# Patient Record
Sex: Female | Born: 1955 | Race: White | Hispanic: No | Marital: Married
Health system: Southern US, Community
[De-identification: ages and names within clinical notes are randomized; demographics above are authoritative.]

## PROBLEM LIST (undated history)

## (undated) DIAGNOSIS — C4490 Unspecified malignant neoplasm of skin, unspecified: Secondary | ICD-10-CM

## (undated) DIAGNOSIS — F419 Anxiety disorder, unspecified: Secondary | ICD-10-CM

## (undated) HISTORY — PX: TUBAL LIGATION: SHX77

## (undated) HISTORY — DX: Anxiety disorder, unspecified: F41.9

## (undated) HISTORY — DX: Unspecified malignant neoplasm of skin, unspecified: C44.90

---

## 2006-08-27 ENCOUNTER — Ambulatory Visit: Payer: Self-pay

## 2008-01-12 ENCOUNTER — Ambulatory Visit: Payer: Self-pay

## 2008-05-18 ENCOUNTER — Ambulatory Visit: Payer: Self-pay | Admitting: Family Medicine

## 2009-04-09 ENCOUNTER — Emergency Department: Payer: Self-pay | Admitting: Emergency Medicine

## 2010-01-29 ENCOUNTER — Ambulatory Visit: Payer: Self-pay | Admitting: Internal Medicine

## 2010-02-23 ENCOUNTER — Ambulatory Visit: Payer: Self-pay | Admitting: Internal Medicine

## 2010-03-01 ENCOUNTER — Ambulatory Visit: Payer: Self-pay | Admitting: Internal Medicine

## 2010-03-31 ENCOUNTER — Ambulatory Visit: Payer: Self-pay | Admitting: Internal Medicine

## 2011-06-12 ENCOUNTER — Ambulatory Visit: Payer: Self-pay

## 2011-09-24 ENCOUNTER — Ambulatory Visit: Payer: Self-pay | Admitting: Orthopedic Surgery

## 2012-10-21 ENCOUNTER — Ambulatory Visit: Payer: Self-pay

## 2013-11-23 ENCOUNTER — Ambulatory Visit: Payer: Self-pay

## 2013-11-24 ENCOUNTER — Ambulatory Visit: Payer: Self-pay

## 2014-10-28 ENCOUNTER — Other Ambulatory Visit: Payer: Self-pay | Admitting: Unknown Physician Specialty

## 2014-10-28 DIAGNOSIS — R922 Inconclusive mammogram: Secondary | ICD-10-CM

## 2014-11-29 ENCOUNTER — Ambulatory Visit: Payer: Self-pay

## 2014-11-29 ENCOUNTER — Ambulatory Visit
Admission: RE | Admit: 2014-11-29 | Discharge: 2014-11-29 | Disposition: A | Discharge status: Home / Self Care | Payer: No Typology Code available for payment source | Source: Ambulatory Visit | Attending: Unknown Physician Specialty | Admitting: Unknown Physician Specialty

## 2014-11-29 ENCOUNTER — Ambulatory Visit: Payer: No Typology Code available for payment source

## 2014-11-29 DIAGNOSIS — R928 Other abnormal and inconclusive findings on diagnostic imaging of breast: Secondary | ICD-10-CM | POA: Insufficient documentation

## 2014-11-29 DIAGNOSIS — R922 Inconclusive mammogram: Secondary | ICD-10-CM

## 2018-09-09 ENCOUNTER — Other Ambulatory Visit: Payer: Self-pay | Admitting: Pediatrics

## 2018-09-09 DIAGNOSIS — Z1231 Encounter for screening mammogram for malignant neoplasm of breast: Secondary | ICD-10-CM

## 2018-09-09 DIAGNOSIS — Z780 Asymptomatic menopausal state: Secondary | ICD-10-CM

## 2019-04-14 ENCOUNTER — Other Ambulatory Visit: Payer: Self-pay

## 2019-04-14 DIAGNOSIS — Z20822 Contact with and (suspected) exposure to covid-19: Secondary | ICD-10-CM

## 2019-04-15 LAB — NOVEL CORONAVIRUS, NAA: SARS-CoV-2, NAA: NOT DETECTED

## 2019-05-21 ENCOUNTER — Other Ambulatory Visit: Payer: Self-pay

## 2019-05-21 DIAGNOSIS — Z20822 Contact with and (suspected) exposure to covid-19: Secondary | ICD-10-CM

## 2019-05-23 LAB — NOVEL CORONAVIRUS, NAA: SARS-CoV-2, NAA: NOT DETECTED

## 2019-08-14 ENCOUNTER — Ambulatory Visit: Payer: Medicare HMO | Attending: Internal Medicine

## 2019-08-14 DIAGNOSIS — Z23 Encounter for immunization: Secondary | ICD-10-CM | POA: Insufficient documentation

## 2019-08-14 NOTE — Progress Notes (Signed)
   Covid-19 Vaccination Clinic  Name:  Stacy Hardy    MRN: [id number] DOB: 1956-03-24  08/14/2019  Stacy Hardy was observed post Covid-19 immunization for 15 minutes without incidence. She was provided with Vaccine Information Sheet and instruction to access the V-Safe system.   Stacy Hardy was instructed to call 911 with any severe reactions post vaccine: Marland Kitchen Difficulty breathing  . Swelling of your face and throat  . A fast heartbeat  . A bad rash all over your body  . Dizziness and weakness    Immunizations Administered    Name Date Dose VIS Date Route   Pfizer COVID-19 Vaccine 08/14/2019 11:24 AM 0.3 mL 06/11/2019 Intramuscular   Manufacturer: [place]   Lot: [id number]   [place]: [id number]

## 2019-09-04 ENCOUNTER — Ambulatory Visit: Payer: Medicare HMO | Attending: Internal Medicine

## 2019-09-04 DIAGNOSIS — Z23 Encounter for immunization: Secondary | ICD-10-CM | POA: Insufficient documentation

## 2019-09-04 NOTE — Progress Notes (Signed)
   Covid-19 Vaccination Clinic  Name:  Stacy Hardy    MRN: [id number] DOB: 15-Nov-1955  09/04/2019  Stacy Hardy was observed post Covid-19 immunization for 15 minutes without incident. She was provided with Vaccine Information Sheet and instruction to access the V-Safe system.   Stacy Hardy was instructed to call 911 with any severe reactions post vaccine: Marland Kitchen Difficulty breathing  . Swelling of face and throat  . A fast heartbeat  . A bad rash all over body  . Dizziness and weakness   Immunizations Administered    Name Date Dose VIS Date Route   Pfizer COVID-19 Vaccine 09/04/2019 10:42 AM 0.3 mL 06/11/2019 Intramuscular   Manufacturer: [place]   Lot: [id number]   [place]: [id number]

## 2020-09-18 ENCOUNTER — Other Ambulatory Visit: Payer: Self-pay | Admitting: Pediatrics

## 2020-09-18 DIAGNOSIS — Z1231 Encounter for screening mammogram for malignant neoplasm of breast: Secondary | ICD-10-CM

## 2020-10-05 ENCOUNTER — Ambulatory Visit
Admission: RE | Admit: 2020-10-05 | Discharge: 2020-10-05 | Disposition: A | Discharge status: Home / Self Care | Payer: Medicare HMO | Source: Ambulatory Visit | Attending: Pediatrics | Admitting: Pediatrics

## 2020-10-05 ENCOUNTER — Other Ambulatory Visit: Payer: Self-pay

## 2020-10-05 DIAGNOSIS — Z1231 Encounter for screening mammogram for malignant neoplasm of breast: Secondary | ICD-10-CM | POA: Diagnosis not present

## 2020-11-20 ENCOUNTER — Other Ambulatory Visit: Payer: Self-pay | Admitting: Pediatrics

## 2020-11-20 DIAGNOSIS — Z780 Asymptomatic menopausal state: Secondary | ICD-10-CM

## 2022-01-09 IMAGING — MG MM DIGITAL SCREENING BILAT W/ TOMO AND CAD
8 series · 8 of 24 positions shown · non-contrast
Comparison: Previous exam(s).

CLINICAL DATA: Screening.

EXAM:
DIGITAL SCREENING BILATERAL MAMMOGRAM WITH TOMOSYNTHESIS AND CAD
TECHNIQUE: Bilateral screening digital craniocaudal and mediolateral oblique
mammograms were obtained. Bilateral screening digital breast
tomosynthesis was performed. The images were evaluated with
computer-aided detection.

[L CC synth-2D]
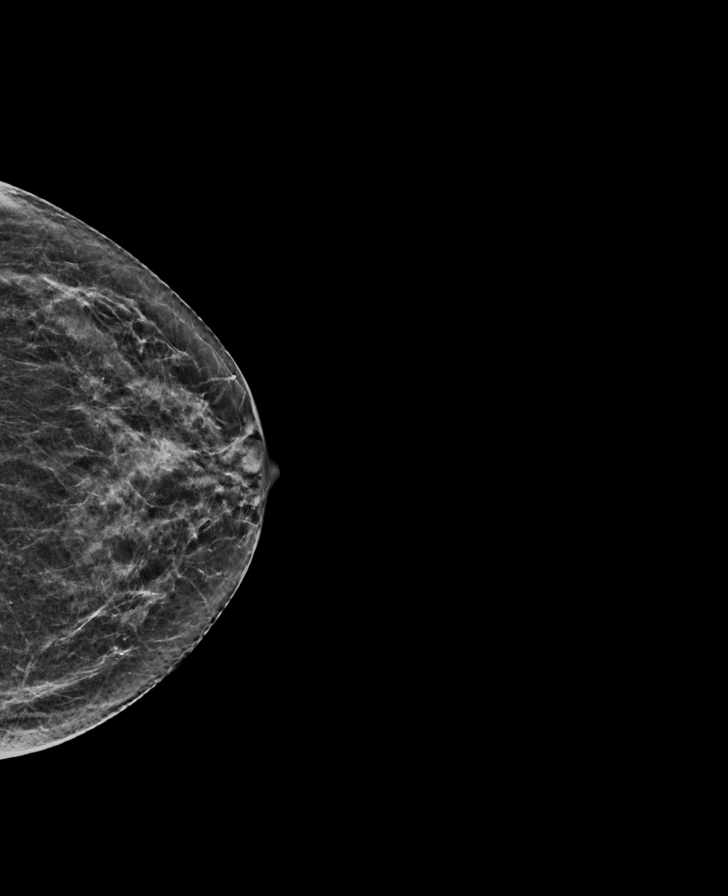

[R MLO synth-2D]
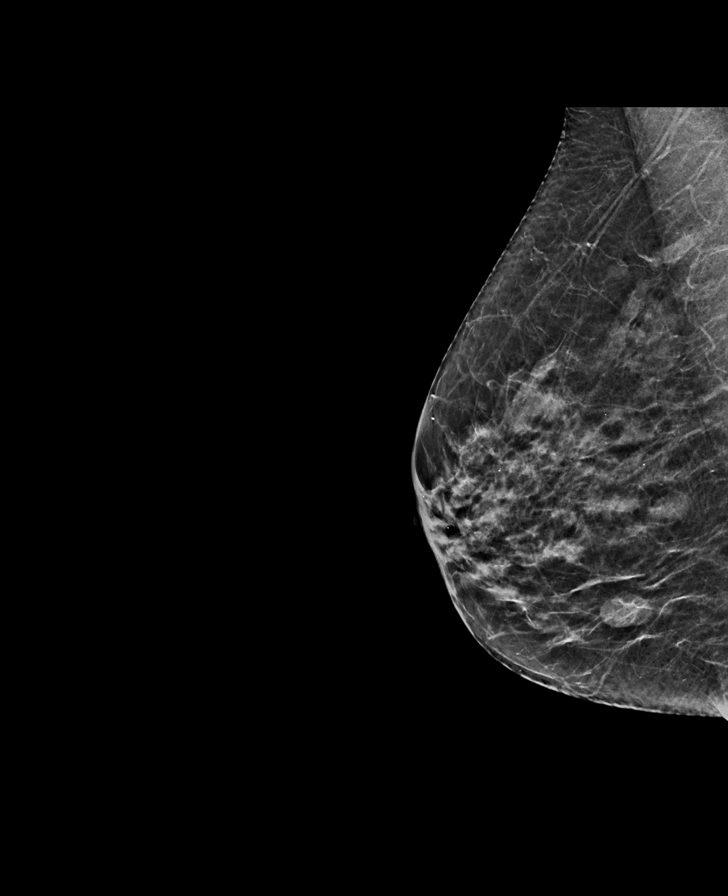

[L MLO synth-2D]
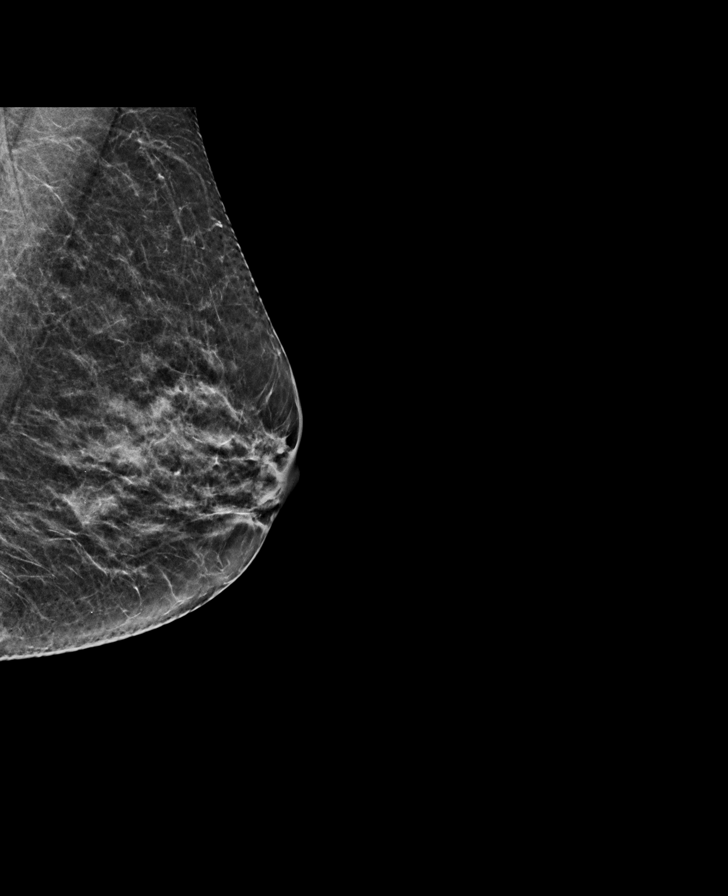

[R CC synth-2D]
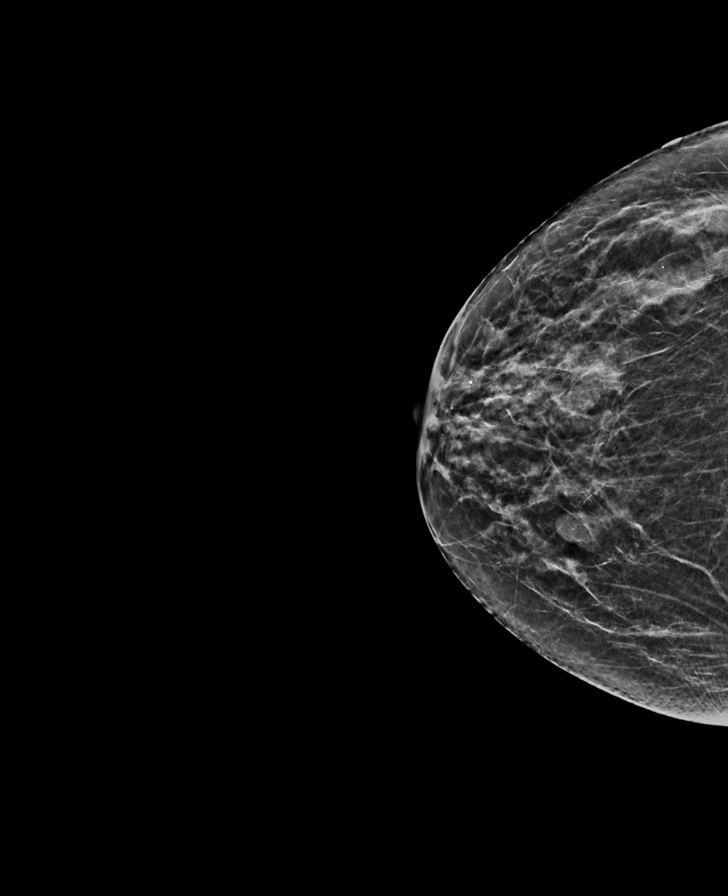

[R MLO tomo · tomo slice 27/52.0]
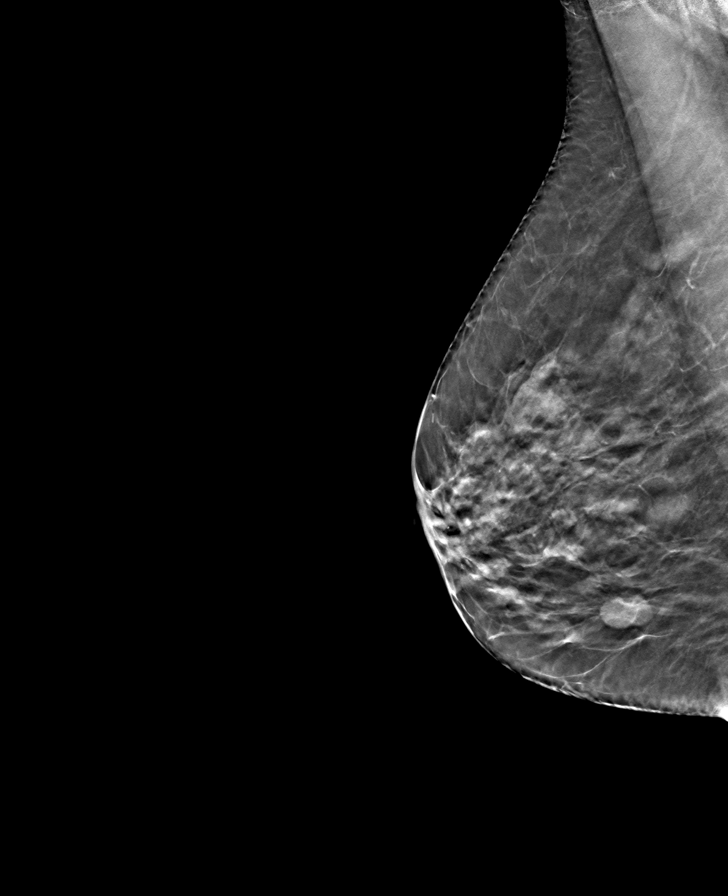

[L CC tomo · tomo slice 26/51.0]
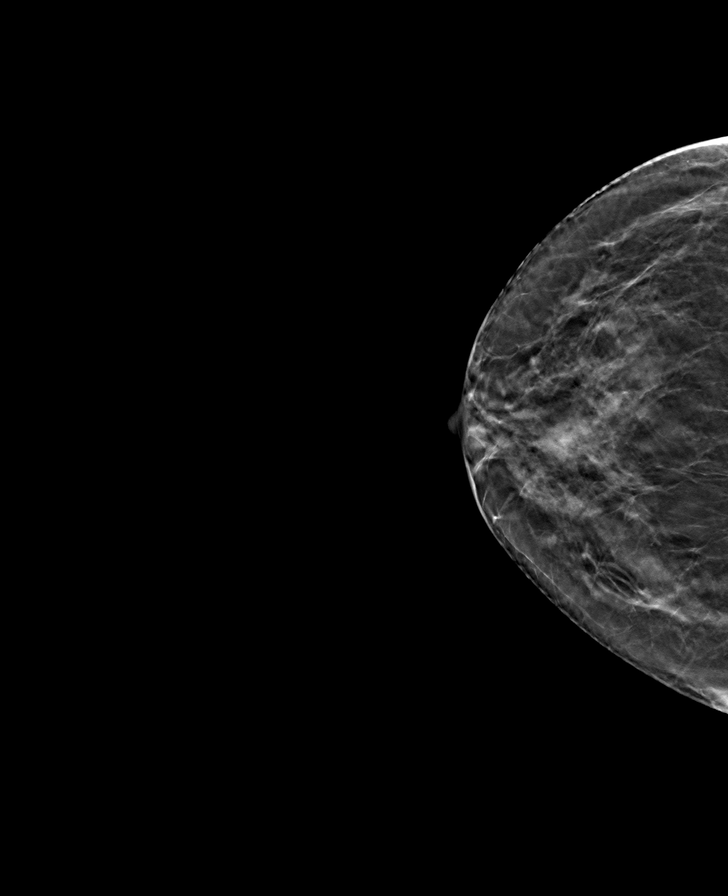

[R CC tomo · tomo slice 25/50.0]
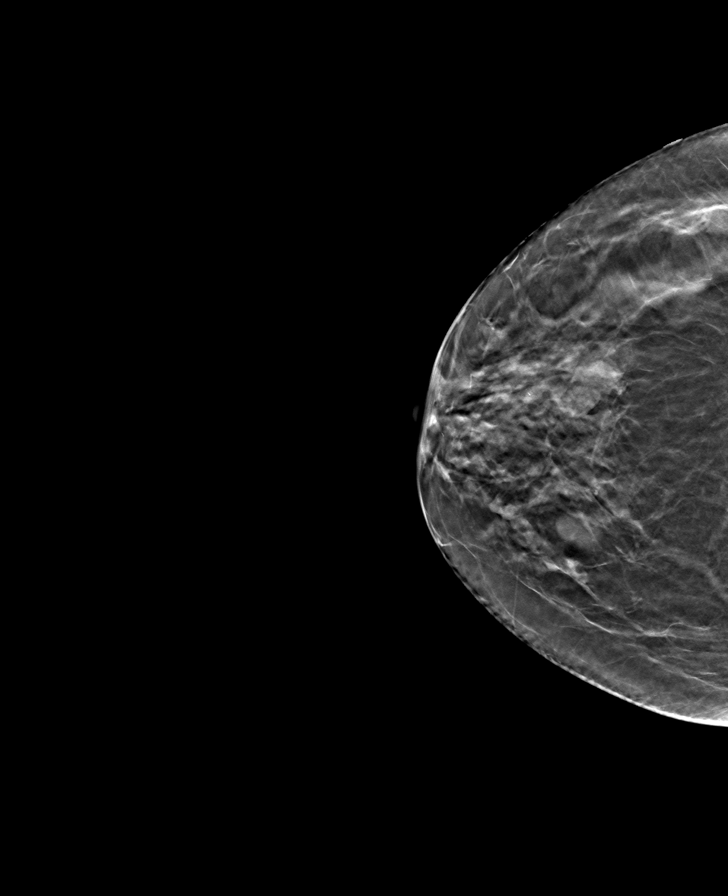

[L MLO tomo · tomo slice 27/54.0]
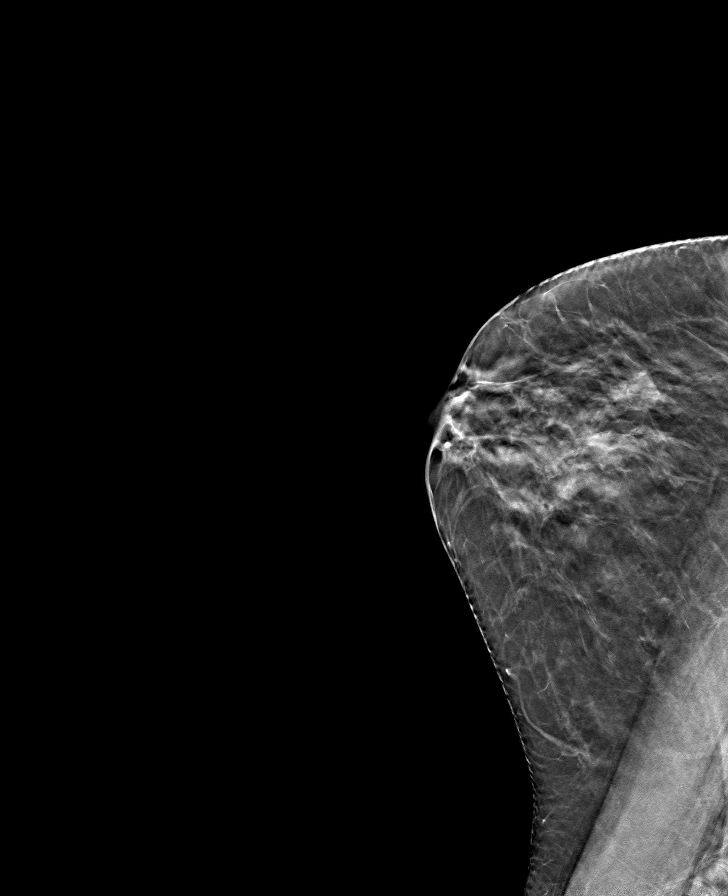

[8 of 24 positions shown; findings below may reference images not displayed]

ACR Breast Density Category c: The breast tissue is heterogeneously
dense, which may obscure small masses.
FINDINGS: There are no findings suspicious for malignancy. The images were
evaluated with computer-aided detection.
IMPRESSION: No mammographic evidence of malignancy. A result letter of this
screening mammogram will be mailed directly to the patient.

RECOMMENDATION:
Screening mammogram in one year. (Code:T4-5-GWO)

BI-RADS CATEGORY  1: Negative.

## 2022-01-29 ENCOUNTER — Other Ambulatory Visit: Payer: Self-pay | Admitting: Pediatrics

## 2022-01-29 DIAGNOSIS — Z1231 Encounter for screening mammogram for malignant neoplasm of breast: Secondary | ICD-10-CM

## 2022-02-05 ENCOUNTER — Ambulatory Visit
Admission: RE | Admit: 2022-02-05 | Discharge: 2022-02-05 | Disposition: A | Discharge status: Home / Self Care | Payer: Medicare HMO | Source: Ambulatory Visit | Attending: Pediatrics | Admitting: Pediatrics

## 2022-02-05 DIAGNOSIS — Z1231 Encounter for screening mammogram for malignant neoplasm of breast: Secondary | ICD-10-CM | POA: Diagnosis present

## 2022-03-01 ENCOUNTER — Other Ambulatory Visit: Payer: Self-pay | Admitting: Pediatrics

## 2022-03-01 DIAGNOSIS — Z780 Asymptomatic menopausal state: Secondary | ICD-10-CM

## 2022-04-16 ENCOUNTER — Ambulatory Visit
Admission: RE | Admit: 2022-04-16 | Discharge: 2022-04-16 | Disposition: A | Discharge status: Home / Self Care | Payer: Medicare HMO | Source: Ambulatory Visit | Attending: Pediatrics | Admitting: Pediatrics

## 2022-04-16 DIAGNOSIS — Z780 Asymptomatic menopausal state: Secondary | ICD-10-CM | POA: Diagnosis present

## 2022-05-06 ENCOUNTER — Ambulatory Visit: Payer: Medicare HMO | Admitting: Dermatology

## 2022-06-19 ENCOUNTER — Ambulatory Visit: Payer: Medicare HMO | Admitting: Dermatology

## 2022-06-19 VITALS — BP 140/85 | HR 96

## 2022-06-19 DIAGNOSIS — L578 Other skin changes due to chronic exposure to nonionizing radiation: Secondary | ICD-10-CM

## 2022-06-19 DIAGNOSIS — C4361 Malignant melanoma of right upper limb, including shoulder: Secondary | ICD-10-CM | POA: Diagnosis not present

## 2022-06-19 DIAGNOSIS — C439 Malignant melanoma of skin, unspecified: Secondary | ICD-10-CM

## 2022-06-19 DIAGNOSIS — L820 Inflamed seborrheic keratosis: Secondary | ICD-10-CM

## 2022-06-19 DIAGNOSIS — I781 Nevus, non-neoplastic: Secondary | ICD-10-CM

## 2022-06-19 DIAGNOSIS — D485 Neoplasm of uncertain behavior of skin: Secondary | ICD-10-CM

## 2022-06-19 HISTORY — DX: Malignant melanoma of skin, unspecified: C43.9

## 2022-06-19 NOTE — Progress Notes (Signed)
   New Patient Visit  Subjective  Stacy Hardy is a 66 y.o. female who presents for the following: Other (Spots of right arm and face). The patient has spots, moles and lesions to be evaluated, some may be new or changing and the patient has concerns that these could be cancer.  The following portions of the chart were reviewed this encounter and updated as appropriate:   Allergies  Meds  Problems  Med Hx  Surg Hx  Fam Hx     Review of Systems:  No other skin or systemic complaints except as noted in HPI or Assessment and Plan.  Objective  Well appearing patient in no apparent distress; mood and affect are within normal limits.  A focused examination was performed including face, right arm. Relevant physical exam findings are noted in the Assessment and Plan.  Right forearm 2.0 x 1.2 cm crusted pink patch  Right Temple Erythematous stuck-on, waxy papule or plaque  Right Tip of Nose Dilated blood vessel   Assessment & Plan  Neoplasm of uncertain behavior of skin Right forearm  Skin / nail biopsy Type of biopsy: tangential   Informed consent: discussed and consent obtained   Timeout: patient name, date of birth, surgical site, and procedure verified   Procedure prep:  Patient was prepped and draped in usual sterile fashion Prep type:  Isopropyl alcohol Anesthesia: the lesion was anesthetized in a standard fashion   Anesthetic:  1% lidocaine w/ epinephrine 1-100,000 buffered w/ 8.4% NaHCO3 Instrument used: flexible razor blade   Hemostasis achieved with: pressure, aluminum chloride and electrodesiccation   Outcome: patient tolerated procedure well   Post-procedure details: sterile dressing applied and wound care instructions given   Dressing type: bandage and petrolatum    Specimen 1 - Surgical pathology Differential Diagnosis: BCC vs other  Check Margins: No  Inflamed seborrheic keratosis Right Temple  Destruction of lesion - Right Temple Complexity: simple    Destruction method: cryotherapy   Informed consent: discussed and consent obtained   Timeout:  patient name, date of birth, surgical site, and procedure verified Lesion destroyed using liquid nitrogen: Yes   Region frozen until ice ball extended beyond lesion: Yes   Outcome: patient tolerated procedure well with no complications   Post-procedure details: wound care instructions given    Telangiectasia Right Tip of Nose Mild blanching telangiectasia - benign.  Actinic Damage - chronic, secondary to cumulative UV radiation exposure/sun exposure over time - diffuse scaly erythematous macules with underlying dyspigmentation - Recommend daily broad spectrum sunscreen SPF 30+ to sun-exposed areas, reapply every 2 hours as needed.  - Recommend staying in the shade or wearing long sleeves, sun glasses (UVA+UVB protection) and wide brim hats (4-inch brim around the entire circumference of the hat). - Call for new or changing lesions.  Return if symptoms worsen or fail to improve.  I, Ashok Cordia, CMA, am acting as scribe for Sarina Ser, MD . Documentation: I have reviewed the above documentation for accuracy and completeness, and I agree with the above.  Sarina Ser, MD

## 2022-06-19 NOTE — Patient Instructions (Signed)
Cryotherapy Aftercare  Wash gently with soap and water everyday.   Apply Vaseline and Band-Aid daily until healed.  Wound Care Instructions  Cleanse wound gently with soap and water once a day then pat dry with clean gauze. Apply a thin coat of Petrolatum (petroleum jelly, "Vaseline") over the wound (unless you have an allergy to this). We recommend that you use a new, sterile tube of Vaseline. Do not pick or remove scabs. Do not remove the yellow or white "healing tissue" from the base of the wound.  Cover the wound with fresh, clean, nonstick gauze and secure with paper tape. You may use Band-Aids in place of gauze and tape if the wound is small enough, but would recommend trimming much of the tape off as there is often too much. Sometimes Band-Aids can irritate the skin.  You should call the office for your biopsy report after 1 week if you have not already been contacted.  If you experience any problems, such as abnormal amounts of bleeding, swelling, significant bruising, significant pain, or evidence of infection, please call the office immediately.  FOR ADULT SURGERY PATIENTS: If you need something for pain relief you may take 1 extra strength Tylenol (acetaminophen) AND 2 Ibuprofen (200mg each) together every 4 hours as needed for pain. (do not take these if you are allergic to them or if you have a reason you should not take them.) Typically, you may only need pain medication for 1 to 3 days.      Due to recent changes in healthcare laws, you may see results of your pathology and/or laboratory studies on MyChart before the doctors have had a chance to review them. We understand that in some cases there may be results that are confusing or concerning to you. Please understand that not all results are received at the same time and often the doctors may need to interpret multiple results in order to provide you with the best plan of care or course of treatment. Therefore, we ask that you  please give us 2 business days to thoroughly review all your results before contacting the office for clarification. Should we see a critical lab result, you will be contacted sooner.   If You Need Anything After Your Visit  If you have any questions or concerns for your doctor, please call our main line at [phone] and press option 4 to reach your doctor's medical assistant. If no one answers, please leave a voicemail as directed and we will return your call as soon as possible. Messages left after 4 pm will be answered the following business day.   You may also send us a message via MyChart. We typically respond to MyChart messages within 1-2 business days.  For prescription refills, please ask your pharmacy to contact our office. Our fax number is [phone].  If you have an urgent issue when the clinic is closed that cannot wait until the next business day, you can page your doctor at the number below.    Please note that while we do our best to be available for urgent issues outside of office hours, we are not available 24/7.   If you have an urgent issue and are unable to reach us, you may choose to seek medical care at your doctor's office, retail clinic, urgent care center, or emergency room.  If you have a medical emergency, please immediately call 911 or go to the emergency department.  Pager Numbers  - Dr. Kowalski: [phone]  -   Dr. Moye: [phone]  - Dr. Stewart: [phone]  In the event of inclement weather, please call our main line at [phone] for an update on the status of any delays or closures.  Dermatology Medication Tips: Please keep the boxes that topical medications come in in order to help keep track of the instructions about where and how to use these. Pharmacies typically print the medication instructions only on the boxes and not directly on the medication tubes.   If your medication is too expensive, please contact our office at  [phone] option 4 or send us a message through MyChart.   We are unable to tell what your co-pay for medications will be in advance as this is different depending on your insurance coverage. However, we may be able to find a substitute medication at lower cost or fill out paperwork to get insurance to cover a needed medication.   If a prior authorization is required to get your medication covered by your insurance company, please allow us 1-2 business days to complete this process.  Drug prices often vary depending on where the prescription is filled and some pharmacies may offer cheaper prices.  The website www.goodrx.com contains coupons for medications through different pharmacies. The prices here do not account for what the cost may be with help from insurance (it may be cheaper with your insurance), but the website can give you the price if you did not use any insurance.  - You can print the associated coupon and take it with your prescription to the pharmacy.  - You may also stop by our office during regular business hours and pick up a GoodRx coupon card.  - If you need your prescription sent electronically to a different pharmacy, notify our office through [facility] MyChart or by phone at [phone] option 4.     Si Usted Necesita Algo Despus de Su Visita  Tambin puede enviarnos un mensaje a travs de MyChart. Por lo general respondemos a los mensajes de MyChart en el transcurso de 1 a 2 das hbiles.  Para renovar recetas, por favor pida a su farmacia que se ponga en contacto con nuestra oficina. Nuestro nmero de fax es el [phone].  Si tiene un asunto urgente cuando la clnica est cerrada y que no puede esperar hasta el siguiente da hbil, puede llamar/localizar a su doctor(a) al nmero que aparece a continuacin.   Por favor, tenga en cuenta que aunque hacemos todo lo posible para estar disponibles para asuntos urgentes fuera del horario de oficina, no estamos  disponibles las 24 horas del da, los 7 das de la semana.   Si tiene un problema urgente y no puede comunicarse con nosotros, puede optar por buscar atencin mdica  en el consultorio de su doctor(a), en una clnica privada, en un centro de atencin urgente o en una sala de emergencias.  Si tiene una emergencia mdica, por favor llame inmediatamente al 911 o vaya a la sala de emergencias.  Nmeros de bper  - Dr. Kowalski: [phone]  - Dra. Moye: [phone]  - Dra. Stewart: [phone]  En caso de inclemencias del tiempo, por favor llame a nuestra lnea principal al [phone] para una actualizacin sobre el estado de cualquier retraso o cierre.  Consejos para la medicacin en dermatologa: Por favor, guarde las cajas en las que vienen los medicamentos de uso tpico para ayudarle a seguir las instrucciones sobre dnde y cmo usarlos. Las farmacias generalmente imprimen las instrucciones del medicamento slo en las cajas y   no directamente en los tubos del medicamento.   Si su medicamento es muy caro, por favor, pngase en contacto con nuestra oficina llamando al [phone] y presione la opcin 4 o envenos un mensaje a travs de MyChart.   No podemos decirle cul ser su copago por los medicamentos por adelantado ya que esto es diferente dependiendo de la cobertura de su seguro. Sin embargo, es posible que podamos encontrar un medicamento sustituto a menor costo o llenar un formulario para que el seguro cubra el medicamento que se considera necesario.   Si se requiere una autorizacin previa para que su compaa de seguros cubra su medicamento, por favor permtanos de 1 a 2 das hbiles para completar este proceso.  Los precios de los medicamentos varan con frecuencia dependiendo del lugar de dnde se surte la receta y alguna farmacias pueden ofrecer precios ms baratos.  El sitio web www.goodrx.com tiene cupones para medicamentos de diferentes farmacias. Los precios aqu no  tienen en cuenta lo que podra costar con la ayuda del seguro (puede ser ms barato con su seguro), pero el sitio web puede darle el precio si no utiliz ningn seguro.  - Puede imprimir el cupn correspondiente y llevarlo con su receta a la farmacia.  - Tambin puede pasar por nuestra oficina durante el horario de atencin regular y recoger una tarjeta de cupones de GoodRx.  - Si necesita que su receta se enve electrnicamente a una farmacia diferente, informe a nuestra oficina a travs de MyChart de [facility] o por telfono llamando al [phone] y presione la opcin 4.  

## 2022-06-29 ENCOUNTER — Encounter: Payer: Self-pay | Admitting: Dermatology

## 2022-07-03 ENCOUNTER — Telehealth: Payer: Self-pay

## 2022-07-03 NOTE — Telephone Encounter (Signed)
Scheduled pt for f/u melanoma matrix./sh

## 2022-07-03 NOTE — Telephone Encounter (Signed)
-----   Message from Ralene Bathe, MD sent at 07/03/2022  5:24 PM EST ----- Diagnosis Skin , right forearm MALIGNANT MELANOMA MELANOMA TABLE (AJCC 8TH EDITION#) PROCEDURE: NOT SPECIFIED SPECIMEN ANATOMIC SITE: RIGHT FOREARM HISTOLOGIC TYPE: SUPERFICIAL SPREADING BRESLOW'S DEPTH/MAXIMUM TUMOR THICKNESS: 0.2 MM CLARK/ANATOMIC LEVEL: II MARGINS PERIPHERAL MARGINS: INVOLVED DEEP MARGIN: FREE ULCERATION: ABSENT SATELLITOSIS: ABSENT MITOTIC INDEX: <1/MM2 (0) LYMPHO-VASCULAR INVASION: ABSENT NEUROTROPISM: ABSENT TUMOR-INFILTRATING LYMPHOCYTES: BRISK TUMOR REGRESSION: ABSENT LYMPH NODES (IF APPLICABLE): N/A PATHOLOGIC STAGE: PT1A COMMENT: A COMPLETE RE-EXCISION IS RECOMMENDED. THIS MELANOMA IS OBSCURED BY THE HALO.  Malignant Melanoma - Invasive B[place]low 0.2 mm Mitoses less than 1 per millimeter squared (0) Pt needs Wide Local Excision. If the rest of the lesion shows any difference in pathology than this piece, then additional procedure(s) may be recommended.  Called and discussed with pt by phone. Please get pt scheduled for Melanoma Matrix within the next week. Also go ahead and put on Surgery schedule now.

## 2022-07-04 ENCOUNTER — Ambulatory Visit: Payer: Medicare HMO | Admitting: Dermatology

## 2022-07-04 DIAGNOSIS — Z7189 Other specified counseling: Secondary | ICD-10-CM

## 2022-07-04 DIAGNOSIS — C439 Malignant melanoma of skin, unspecified: Secondary | ICD-10-CM

## 2022-07-04 DIAGNOSIS — C4361 Malignant melanoma of right upper limb, including shoulder: Secondary | ICD-10-CM

## 2022-07-04 NOTE — Progress Notes (Signed)
   Follow-Up Visit   Subjective  Stacy Hardy is a 67 y.o. female who presents for the following: Follow-up (Patient here with daughter for results of biopsy and to discuss treatment. Bx proven malignant melanoma at right forearm. ).  The following portions of the chart were reviewed this encounter and updated as appropriate:  Allergies  Meds  Problems  Med Hx  Surg Hx  Fam Hx     Review of Systems: No other skin or systemic complaints except as noted in HPI or Assessment and Plan.  Objective  Well appearing patient in no apparent distress; mood and affect are within normal limits.  A focused examination was performed including right forearm. Relevant physical exam findings are noted in the Assessment and Plan.  right forearm Healing bx   Assessment & Plan  Malignant melanoma of skin ([place]) right forearm  Bx proven : malignant melanoma at right forearm  06/19/2022 Accession:[id number] BRESLOW'S DEPTH/MAXIMUM TUMOR THICKNESS: 0.2 MM CLARK/ANATOMIC LEVEL: II  Scientist, forensic paper work filled out and faxed. Patient scheduled for surgery January 23 @ 4:30 pm Preop information included in patient handout  COUNSELING: Melanoma Matrix Counseling Discussed diagnosis in detail including significance of melanoma diagnosis which can be potentially lethal.  Discussed treatment recommendations in detail advising that treatment recommendations are based on longitudinal studies and retrospective studies and are nationwide protocols.  Advised there is always potential for melanoma recurrence even after definitive treatment.  After definitive treatment, we recommend Skin Cancer Screening Exams (with total-body skin exams) every 3 months for a year; then every 4 months for a year; then every 6 months for 3 years.  At 5 years post treatment, if all appears well,  we would recommend at least yearly Skin Cancer Screenings (with total-body skin exams) for the rest of your life.  The patient  was given time for questions and these were answered.  We recommend frequent self skin examinations; photoprotection with sunscreen, sun protective clothing, hats, sunglasses and sun avoidance.  If the patient notices any new or changing skin lesions the patient should return to the office immediately for evaluation.   IRuthell Rummage, CMA, am acting as scribe for Sarina Ser, MD. Documentation: I have reviewed the above documentation for accuracy and completeness, and I agree with the above.  Sarina Ser, MD

## 2022-07-04 NOTE — Patient Instructions (Addendum)
there is always potential for melanoma recurrence even after definitive treatment.  After definitive treatment, we recommend Skin Cancer Screening Exams (with total-body skin exams) every 3 months for a year; then every 4 months for a year; then every 6 months for 3 years.  At 5 years post treatment, if all appears well,  we would recommend at least yearly Skin Cancer Screenings (with total-body skin exams) for the rest of your life.  The patient was given time for questions and these were answered.  We recommend frequent self skin examinations; photoprotection with sunscreen, sun protective clothing, hats, sunglasses and sun avoidance.  If the patient notices any new or changing skin lesions the patient should return to the office immediately for evaluation.     Pre-Operative Instructions  You are scheduled for a surgical procedure at [facility]. We recommend you read the following instructions. If you have any questions or concerns, please call the office at [phone].  Shower and wash the entire body with soap and water the day of your surgery paying special attention to cleansing at and around the planned surgery site.  Avoid aspirin or aspirin containing products at least fourteen (14) days prior to your surgical procedure and for at least one week (7 Days) after your surgical procedure. If you take aspirin on a regular basis for heart disease or history of stroke or for any other reason, we may recommend you continue taking aspirin but please notify us if you take this on a regular basis. Aspirin can cause more bleeding to occur during surgery as well as prolonged bleeding and bruising after surgery.   Avoid other nonsteroidal pain medications at least one week prior to surgery and at least one week prior to your surgery. These include medications such as Ibuprofen (Motrin, Advil and Nuprin), Naprosyn, Voltaren, Relafen, etc. If medications are used for therapeutic reasons,  please inform us as they can cause increased bleeding or prolonged bleeding during and bruising after surgical procedures.   Please advise [place] if you are taking any "blood thinner" medications such as Coumadin or Dipyridamole or Plavix or similar medications. These cause increased bleeding and prolonged bleeding during procedures and bruising after surgical procedures. We may have to consider discontinuing these medications briefly prior to and shortly after your surgery if safe to do so.   Please inform us of all medications you are currently taking. All medications that are taken regularly should be taken the day of surgery as you always do. Nevertheless, we need to be informed of what medications you are taking prior to surgery to know whether they will affect the procedure or cause any complications.   Please inform us of any medication allergies. Also inform us of whether you have allergies to Latex or rubber products or whether you have had any adverse reaction to Lidocaine or Epinephrine.  Please inform us of any prosthetic or artificial body parts such as artificial heart valve, joint replacements, etc., or similar condition that might require preoperative antibiotics.   We recommend avoidance of alcohol at least two weeks prior to surgery and continued avoidance for at least two weeks after surgery.   We recommend discontinuation of tobacco smoking at least two weeks prior to surgery and continued abstinence for at least two weeks after surgery.  Do not plan strenuous exercise, strenuous work or strenuous lifting for approximately four weeks after your surgery.   We request if you are unable to make your scheduled surgical appointment, please call  us at least a week in advance or as soon as you are aware of a problem so that we can cancel or reschedule the appointment.   You MAY TAKE TYLENOL (acetaminophen) for pain as it is not a blood thinner.   PLEASE PLAN TO BE IN TOWN FOR TWO WEEKS  FOLLOWING SURGERY, THIS IS IMPORTANT SO YOU CAN BE CHECKED FOR DRESSING CHANGES, SUTURE REMOVAL AND TO MONITOR FOR POSSIBLE COMPLICATIONS.         Melanoma ABCDEs  Melanoma is the most dangerous type of skin cancer, and is the leading cause of death from skin disease.  You are more likely to develop melanoma if you: Have light-colored skin, light-colored eyes, or red or blond hair Spend a lot of time in the sun Tan regularly, either outdoors or in a tanning bed Have had blistering sunburns, especially during childhood Have a close family member who has had a melanoma Have atypical moles or large birthmarks  Early detection of melanoma is key since treatment is typically straightforward and cure rates are extremely high if we catch it early.   The first sign of melanoma is often a change in a mole or a new dark spot.  The ABCDE system is a way of remembering the signs of melanoma.  A for asymmetry:  The two halves do not match. B for border:  The edges of the growth are irregular. C for color:  A mixture of colors are present instead of an even brown color. D for diameter:  Melanomas are usually (but not always) greater than 5m - the size of a pencil eraser. E for evolution:  The spot keeps changing in size, shape, and color.  Please check your skin once per month between visits. You can use a small mirror in front and a large mirror behind you to keep an eye on the back side or your body.   If you see any new or changing lesions before your next follow-up, please call to schedule a visit.  Please continue daily skin protection including broad spectrum sunscreen SPF 30+ to sun-exposed areas, reapplying every 2 hours as needed when you're outdoors.   Staying in the shade or wearing long sleeves, sun glasses (UVA+UVB protection) and wide brim hats (4-inch brim around the entire circumference of the hat) are also recommended for sun protection.      Due to recent changes in  healthcare laws, you may see results of your pathology and/or laboratory studies on MyChart before the doctors have had a chance to review them. We understand that in some cases there may be results that are confusing or concerning to you. Please understand that not all results are received at the same time and often the doctors may need to interpret multiple results in order to provide you with the best plan of care or course of treatment. Therefore, we ask that you please give u[place]2 business days to thoroughly review all your results before contacting the office for clarification. Should we see a critical lab result, you will be contacted sooner.   If You Need Anything After Your Visit  If you have any questions or concerns for your doctor, please call our main line at [phone] press option 4 to reach your doctor's medical assistant. If no one answers, please leave a voicemail as directed and we will return your call as soon as possible. Messages left after 4 pm will be answered the following business day.   You may  also send [place] a message via MyChart. We typically respond to MyChart messages within 1-2 business days.  For prescription refills, please ask your pharmacy to contact our office. Our fax number is [phone].  If you have an urgent issue when the clinic is closed that cannot wait until the next business day, you can page your doctor at the number below.    Please note that while we do our best to be available for urgent issues outside of office hours, we are not available 24/7.   If you have an urgent issue and are unable to reach [place], you may choose to seek medical care at your doctor's office, retail clinic, urgent care center, or emergency room.  If you have a medical emergency, please immediately call 911 or go to the emergency department.  Pager Numbers  - Dr. Nehemiah Massed: [phone]  - Dr. Laurence Ferrari: [phone]  - Dr. Nicole Kindred: [phone]  In the event of inclement  weather, please call our main line at [phone] for an update on the status of any delays or closures.  Dermatology Medication Tips: Please keep the boxes that topical medications come in in order to help keep track of the instructions about where and how to use these. Pharmacies typically print the medication instructions only on the boxes and not directly on the medication tubes.   If your medication is too expensive, please contact our office at [phone] option 4 or send [place] a message through [place].   We are unable to tell what your co-pay for medications will be in advance as this is different depending on your insurance coverage. However, we may be able to find a substitute medication at lower cost or fill out paperwork to get insurance to cover a needed medication.   If a prior authorization is required to get your medication covered by your insurance company, please allow [place] 1-2 business days to complete this process.  Drug prices often vary depending on where the prescription is filled and some pharmacies may offer cheaper prices.  The website www.goodrx.com contains coupons for medications through different pharmacies. The prices here do not account for what the cost may be with help from insurance (it may be cheaper with your insurance), but the website can give you the price if you did not use any insurance.  - You can print the associated coupon and take it with your prescription to the pharmacy.  - You may also stop by our office during regular business hours and pick up a GoodRx coupon card.  - If you need your prescription sent electronically to a different pharmacy, notify our office through [facility] or by phone at [phone] option 4.     Si Usted Necesita Algo Despus de Su Visita  Tambin puede enviarnos un mensaje a travs de Pharmacist, community. Por lo general respondemos a los mensajes de MyChart en el transcurso de 1 a 2 das hbiles.  Para renovar recetas,  por favor pida a su farmacia que se ponga en contacto con nuestra oficina. Harland Dingwall de fax es [place] [phone].  Si tiene un asunto urgente cuando la clnica est cerrada y que no puede esperar hasta el siguiente da hbil, puede llamar/localizar a su doctor(a) al nmero que aparece a continuacin.   Por favor, tenga en cuenta que aunque hacemos todo lo posible para estar disponibles para asuntos urgentes fuera del horario de [place], no estamos disponibles las 24 horas del da, los 7 das de la [place].   Si  tiene un problema urgente y no puede comunicarse con nosotros, puede optar por buscar atencin mdica  en el consultorio de su doctor(a), en una clnica privada, en un centro de atencin urgente o en una sala de emergencias.  Si tiene Engineering geologist, por favor llame inmediatamente al 911 o vaya a la sala de emergencias.  Nmeros de bper  - Dr. Nehemiah Massed: [phone]  - Dra. Moye: [phone]  - Dra. Nicole Kindred: [phone]  En caso de inclemencias del [place], por favor llame a Johnsie Kindred principal al [phone] para una actualizacin sobre el [place] de cualquier retraso o cierre.  Consejos para la medicacin en dermatologa: Por favor, guarde las cajas en las que vienen los medicamentos de uso tpico para ayudarle a seguir las instrucciones sobre dnde y cmo usarlos. Las farmacias generalmente imprimen las instrucciones del medicamento slo en las cajas y no directamente en los tubos del [place].   Si su medicamento es muy caro, por favor, pngase en contacto con Zigmund Daniel llamando al [phone] y presione la opcin 4 o envenos un mensaje a travs de Pharmacist, community.   No podemos decirle cul ser su copago por los medicamentos por adelantado ya que esto es diferente dependiendo de la cobertura de su seguro. Sin embargo, es posible que podamos encontrar un medicamento sustituto a Electrical engineer un formulario para que el seguro cubra el medicamento que se  considera necesario.   Si se requiere una autorizacin previa para que su compaa de seguros [place] su medicamento, por favor permtanos de 1 a 2 das hbiles para completar este proceso.  Los precios de los medicamentos varan con frecuencia dependiendo del Environmental consultant de dnde se surte la receta y alguna farmacias pueden ofrecer precios ms baratos.  El sitio web www.goodrx.com tiene cupones para medicamentos de Airline pilot. Los precios aqu no tienen en cuenta lo que podra costar con la ayuda del seguro (puede ser ms barato con su seguro), pero el sitio web puede darle el precio si no utiliz Research scientist (physical sciences).  - Puede imprimir el cupn correspondiente y llevarlo con su receta a la farmacia.  - Tambin puede pasar por nuestra oficina durante el horario de atencin regular y Charity fundraiser una tarjeta de cupones de GoodRx.  - Si necesita que su receta se enve electrnicamente a una farmacia diferente, informe a nuestra oficina a travs de MyChart de [place] o por telfono llamando al [phone] y presione la opcin 4.

## 2022-07-06 ENCOUNTER — Encounter: Payer: Self-pay | Admitting: Dermatology

## 2022-07-09 ENCOUNTER — Ambulatory Visit (INDEPENDENT_AMBULATORY_CARE_PROVIDER_SITE_OTHER): Payer: Medicare HMO | Admitting: Dermatology

## 2022-07-09 ENCOUNTER — Telehealth: Payer: Self-pay

## 2022-07-09 ENCOUNTER — Encounter: Payer: Self-pay | Admitting: Dermatology

## 2022-07-09 DIAGNOSIS — L820 Inflamed seborrheic keratosis: Secondary | ICD-10-CM | POA: Diagnosis not present

## 2022-07-09 DIAGNOSIS — C439 Malignant melanoma of skin, unspecified: Secondary | ICD-10-CM

## 2022-07-09 DIAGNOSIS — L821 Other seborrheic keratosis: Secondary | ICD-10-CM

## 2022-07-09 DIAGNOSIS — D229 Melanocytic nevi, unspecified: Secondary | ICD-10-CM

## 2022-07-09 DIAGNOSIS — Z1283 Encounter for screening for malignant neoplasm of skin: Secondary | ICD-10-CM

## 2022-07-09 DIAGNOSIS — L578 Other skin changes due to chronic exposure to nonionizing radiation: Secondary | ICD-10-CM | POA: Diagnosis not present

## 2022-07-09 DIAGNOSIS — D224 Melanocytic nevi of scalp and neck: Secondary | ICD-10-CM | POA: Diagnosis not present

## 2022-07-09 DIAGNOSIS — L814 Other melanin hyperpigmentation: Secondary | ICD-10-CM

## 2022-07-09 DIAGNOSIS — C4361 Malignant melanoma of right upper limb, including shoulder: Secondary | ICD-10-CM | POA: Diagnosis not present

## 2022-07-09 MED ORDER — MUPIROCIN 2 % EX OINT: 1.0000 | TOPICAL_OINTMENT | Freq: Every day | CUTANEOUS | 0 refills | Status: DC

## 2022-07-09 MED ORDER — DOXYCYCLINE MONOHYDRATE 100 MG PO CAPS: 100.0000 mg | ORAL_CAPSULE | Freq: Two times a day (BID) | ORAL | 0 refills | Status: AC

## 2022-07-09 NOTE — Progress Notes (Signed)
Follow-Up Visit   Subjective  TAKERIA Hardy is a 67 y.o. 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 The patient presents for Total-Body Skin Exam (TBSE) for skin cancer screening and mole check.  The patient has spots, moles and lesions to be evaluated, some may be new or changing and the patient has concerns that these could be cancer./).  The following portions of the chart were reviewed this encounter and updated as appropriate:   Allergies  Meds  Problems  Med Hx  Surg Hx  Fam Hx     Review of Systems:  No other skin or systemic complaints except as noted in HPI or Assessment and Plan.  Objective  Well appearing patient in no apparent distress; mood and affect are within normal limits.  A full examination was performed including scalp, head, eyes, ears, nose, lips, neck, chest, axillae, abdomen, back, buttocks, bilateral upper extremities, bilateral lower extremities, hands, feet, fingers, toes, fingernails, and toenails. All findings within normal limits unless otherwise noted below.  Right Forearm - Posterior Healing biopsy site. No lymphadenopathy.  Right prox forearm x 1, above left medial brow x 1 (2) Erythematous stuck-on, waxy papule or plaque  Right scalp/temple area 0.3 cm flesh colored papule of right scalp/temple area. Some slightly irregular brown macules of mid back.   Assessment & Plan   Lentigines - Scattered tan macules - Due to sun exposure - Benign-appearing, observe - Recommend daily broad spectrum sunscreen SPF 30+ to sun-exposed areas, reapply every 2 hours as needed. - Call for any changes  Seborrheic Keratoses - Stuck-on, waxy, tan-brown papules and/or plaques  - Benign-appearing - Discussed benign etiology and prognosis. - Observe - Call for any changes  Melanocytic Nevi - Tan-brown and/or pink-flesh-colored symmetric macules and papules - Benign appearing on  exam today - Observation - Call clinic for new or changing moles - Recommend daily use of broad spectrum spf 30+ sunscreen to sun-exposed areas.   Hemangiomas - Red papules - Discussed benign nature - Observe - Call for any changes  Actinic Damage - Chronic condition, secondary to cumulative UV/sun exposure - diffuse scaly erythematous macules with underlying dyspigmentation - Recommend daily broad spectrum sunscreen SPF 30+ to sun-exposed areas, reapply every 2 hours as needed.  - Staying in the shade or wearing long sleeves, sun glasses (UVA+UVB protection) and wide brim hats (4-inch brim around the entire circumference of the hat) are also recommended for sun protection.  - Call for new or changing lesions.  Skin cancer screening performed today.  Malignant melanoma of skin (HCC) Right Forearm - Posterior Skin excision  Lesion length (cm):  2.2 Lesion width (cm):  2.2 Margin per side (cm):  1 Total excision diameter (cm):  4.2 Informed consent: discussed and consent obtained   Timeout: patient name, date of birth, surgical site, and procedure verified   Procedure prep:  Patient was prepped and draped in usual sterile fashion Prep type:  Isopropyl alcohol and povidone-iodine Anesthesia: the lesion was anesthetized in a standard fashion   Anesthetic:  1% lidocaine w/ epinephrine 1-100,000 buffered w/ 8.4% NaHCO3 Instrument used: #15 blade   Hemostasis achieved with: suture and pressure   Hemostasis achieved with comment:  Electrocautery Outcome: patient tolerated procedure well with no complications   Post-procedure details: sterile dressing applied and wound care instructions given   Dressing type: bandage and pressure dressing (mupirocin)   Additional details:  Tagged with suture 12:00  proximal edge  Skin repair Complexity:  Complex Final length (cm):  6 Reason for type of repair: reduce tension to allow closure, reduce the risk of dehiscence, infection, and necrosis,  reduce subcutaneous dead space and avoid a hematoma, allow closure of the large defect, preserve normal anatomy, preserve normal anatomical and functional relationships and enhance both functionality and cosmetic results   Undermining: area extensively undermined   Undermining comment:  Undermining defect 4.2 cm Subcutaneous layers (deep stitches):  Suture size:  2-0 Suture type: Vicryl (polyglactin 910)   Subcutaneous suture technique: inverted dermal. Fine/surface layer approximation (top stitches):  Suture size:  2-0 Suture type: nylon   Stitches: simple running   Suture removal (days):  7 Hemostasis achieved with: suture and pressure Outcome: patient tolerated procedure well with no complications   Post-procedure details: sterile dressing applied and wound care instructions given   Dressing type: bandage and pressure dressing (mupirocin)   Additional details:  Suture at prox 12 oclock edge  mupirocin ointment (BACTROBAN) 2 % Apply 1 Application topically daily. With dressing changes  doxycycline (MONODOX) 100 MG capsule Take 1 capsule (100 mg total) by mouth 2 (two) times daily for 7 days. Take with food and drink  Specimen 1 - Surgical pathology Differential Diagnosis: Biopsy proven Malignant Melanoma Check Margins: Yes [id number] Suture at prox 12 oclock edge  Mupirocin ointment qd with dressing changes Start Doxycycline '100mg'$  1 po bid with food and drink  Doxycycline should be taken with food to prevent nausea. Do not lay down for 30 minutes after taking. Be cautious with sun exposure and use good sun protection while on this medication. Pregnant women should not take this medication.    Inflamed seborrheic keratosis (2) Right prox forearm x 1, above left medial brow x 1  Destruction of lesion - Right prox forearm x 1, above left medial brow x 1 Complexity: simple   Destruction method: cryotherapy   Informed consent: discussed and consent obtained   Timeout:  patient  name, date of birth, surgical site, and procedure verified Lesion destroyed using liquid nitrogen: Yes   Region frozen until ice ball extended beyond lesion: Yes   Outcome: patient tolerated procedure well with no complications   Post-procedure details: wound care instructions given    Nevus Right scalp/temple area Benign-appearing.  Observation.  Call clinic for new or changing lesions.  Recommend daily use of broad spectrum spf 30+ sunscreen to sun-exposed areas.   Return in about 1 week (around 07/16/2022).  I, Ashok Cordia, CMA, am acting as scribe for Sarina Ser, MD . Documentation: I have reviewed the above documentation for accuracy and completeness, and I agree with the above.  Sarina Ser, MD

## 2022-07-09 NOTE — Telephone Encounter (Signed)
Patient doing fine after today's surgery./sh 

## 2022-07-09 NOTE — Patient Instructions (Addendum)
Wound Care Instructions  On the day following your surgery, you should begin doing daily dressing changes: Remove the old dressing and discard it. Cleanse the wound gently with tap water. This may be done in the shower or by placing a wet gauze pad directly on the wound and letting it soak for several minutes. It is important to gently remove any dried blood from the wound in order to encourage healing. This may be done by gently rolling a moistened Q-tip on the dried blood. Do not pick at the wound. If the wound should start to bleed, continue cleaning the wound, then place a moist gauze pad on the wound and hold pressure for a few minutes.  Make sure you then dry the skin surrounding the wound completely or the tape will not stick to the skin. Do not use cotton balls on the wound. After the wound is clean and dry, apply the ointment gently with a Q-tip. Cut a non-stick pad to fit the size of the wound. Lay the pad flush to the wound. If the wound is draining, you may want to reinforce it with a small amount of gauze on top of the non-stick pad for a little added compression to the area. Use the tape to seal the area completely. Select from the following with respect to your individual situation: If your wound has been stitched closed: continue the above steps 1-8 at least daily until your sutures are removed. If your wound has been left open to heal: continue steps 1-8 at least daily for the first 3-4 weeks. We would like for you to take a few extra precautions for at least the next week. Sleep with your head elevated on pillows if our wound is on your head. Do not bend over or lift heavy items to reduce the chance of elevated blood pressure to the wound Do not participate in particularly strenuous activities.   Below is a list of dressing supplies you might need.  Cotton-tipped applicators - Q-tips Gauze pads (2x2 and/or 4x4) - All-Purpose Sponges Non-stick dressing material - Telfa Tape -  Paper or Hypafix New and clean tube of petroleum jelly - Vaseline    Comments on Post-Operative Period Slight swelling and redness often appear around the wound. This is normal and will disappear within several days following the surgery. The healing wound will drain a brownish-red-yellow discharge during healing. This is a normal phase of wound healing. As the wound begins to heal, the drainage may increase in amount. Again, this drainage is normal. Notify us if the drainage becomes persistently bloody, excessively swollen, or intensely painful or develops a foul odor or red streaks.  If you should experience mild discomfort during the healing phase, you may take an aspirin-free medication such as Tylenol (acetaminophen). Notify us if the discomfort is severe or persistent. Avoid alcoholic beverages when taking pain medicine.  In Case of Wound Hemorrhage A wound hemorrhage is when the bandage suddenly becomes soaked with bright red blood and flows profusely. If this happens, sit down or lie down with your head elevated. If the wound has a dressing on it, do not remove the dressing. Apply pressure to the existing gauze. If the wound is not covered, use a gauze pad to apply pressure and continue applying the pressure for 20 minutes without peeking. DO NOT COVER THE WOUND WITH A LARGE TOWEL OR WASH CLOTH. Release your hand from the wound site but do not remove the dressing. If the bleeding has stopped,   gently clean around the wound. Leave the dressing in place for 24 hours if possible. This wait time allows the blood vessels to close off so that you do not spark a new round of bleeding by disrupting the newly clotted blood vessels with an immediate dressing change. If the bleeding does not subside, continue to hold pressure. If matters are out of your control, contact an After Hours clinic or go to the Emergency Room.   Cryotherapy Aftercare  Wash gently with soap and water everyday.   Apply Vaseline  and Band-Aid daily until healed.    Doxycycline should be taken with food to prevent nausea. Do not lay down for 30 minutes after taking. Be cautious with sun exposure and use good sun protection while on this medication. Pregnant women should not take this medication.    Due to recent changes in healthcare laws, you may see results of your pathology and/or laboratory studies on MyChart before the doctors have had a chance to review them. We understand that in some cases there may be results that are confusing or concerning to you. Please understand that not all results are received at the same time and often the doctors may need to interpret multiple results in order to provide you with the best plan of care or course of treatment. Therefore, we ask that you please give [place] 2 business days to thoroughly review all your results before contacting the office for clarification. Should we see a critical lab result, you will be contacted sooner.   If You Need Anything After Your Visit  If you have any questions or concerns for your doctor, please call our main line at [phone] and press option 4 to reach your doctor's medical assistant. If no one answers, please leave a voicemail as directed and we will return your call as soon as possible. Messages left after 4 pm will be answered the following business day.   You may also send [place] a message via [place]. We typically respond to MyChart messages within 1-2 business days.  For prescription refills, please ask your pharmacy to contact our office. Our fax number is [phone].  If you have an urgent issue when the clinic is closed that cannot wait until the next business day, you can page your doctor at the number below.    Please note that while we do our best to be available for urgent issues outside of office hours, we are not available 24/7.   If you have an urgent issue and are unable to reach [place], you may choose to seek medical care at your  doctor's office, retail clinic, urgent care center, or emergency room.  If you have a medical emergency, please immediately call 911 or go to the emergency department.  Pager Numbers  - Dr. Nehemiah Massed: [phone]  - Dr. Laurence Ferrari: [phone]  - Dr. Nicole Kindred: [phone]  In the event of inclement weather, please call our main line at [phone] for an update on the status of any delays or closures.  Dermatology Medication Tips: Please keep the boxes that topical medications come in in order to help keep track of the instructions about where and how to use these. Pharmacies typically print the medication instructions only on the boxes and not directly on the medication tubes.   If your medication is too expensive, please contact our office at [phone] option 4 or send [place] a message through [place].   We are unable to tell what your co-pay for medications will be  in advance as this is different depending on your insurance coverage. However, we may be able to find a substitute medication at lower cost or fill out paperwork to get insurance to cover a needed medication.   If a prior authorization is required to get your medication covered by your insurance company, please allow [place] 1-2 business days to complete this process.  Drug prices often vary depending on where the prescription is filled and some pharmacies may offer cheaper prices.  The website www.goodrx.com contains coupons for medications through different pharmacies. The prices here do not account for what the cost may be with help from insurance (it may be cheaper with your insurance), but the website can give you the price if you did not use any insurance.  - You can print the associated coupon and take it with your prescription to the pharmacy.  - You may also stop by our office during regular business hours and pick up a GoodRx coupon card.  - If you need your prescription sent electronically to a different pharmacy, notify  our office through [facility] or by phone at [phone] option 4.     Si Usted Necesita Algo Despus de Su Visita  Tambin puede enviarnos un mensaje a travs de Pharmacist, community. Por lo general respondemos a los mensajes de MyChart en el transcurso de 1 a 2 das hbiles.  Para renovar recetas, por favor pida a su farmacia que se ponga en contacto con nuestra oficina. Harland Dingwall de fax es [place] [phone].  Si tiene un asunto urgente cuando la clnica est cerrada y que no puede esperar hasta el siguiente da hbil, puede llamar/localizar a su doctor(a) al nmero que aparece a continuacin.   Por favor, tenga en cuenta que aunque hacemos todo lo posible para estar disponibles para asuntos urgentes fuera del horario de [place], no estamos disponibles las 24 horas del da, los 7 das de la [place].   Si tiene un problema urgente y no puede comunicarse con nosotros, puede optar por buscar atencin mdica  en el consultorio de su doctor(a), en una clnica privada, en un centro de atencin urgente o en una sala de emergencias.  Si tiene Engineering geologist, por favor llame inmediatamente al 911 o vaya a la sala de emergencias.  Nmeros de bper  - Dr. Nehemiah Massed: [phone]  - Dra. Moye: [phone]  - Dra. Nicole Kindred: [phone]  En caso de inclemencias del [place], por favor llame a Johnsie Kindred principal al [phone] para una actualizacin sobre el [place] de cualquier retraso o cierre.  Consejos para la medicacin en dermatologa: Por favor, guarde las cajas en las que vienen los medicamentos de uso tpico para ayudarle a seguir las instrucciones sobre dnde y cmo usarlos. Las farmacias generalmente imprimen las instrucciones del medicamento slo en las cajas y no directamente en los tubos del [place].   Si su medicamento es muy caro, por favor, pngase en contacto con Zigmund Daniel llamando al [phone] y presione la opcin 4 o envenos un mensaje a travs de  Pharmacist, community.   No podemos decirle cul ser su copago por los medicamentos por adelantado ya que esto es diferente dependiendo de la cobertura de su seguro. Sin embargo, es posible que podamos encontrar un medicamento sustituto a Electrical engineer un formulario para que el seguro cubra el medicamento que se considera necesario.   Si se requiere una autorizacin previa para que su compaa de seguros [place] su medicamento, por favor permtanos de 1 a  2 das hbiles para completar este proceso.  Los precios de los medicamentos varan con frecuencia dependiendo del lugar de dnde se surte la receta y alguna farmacias pueden ofrecer precios ms baratos.  El sitio web www.goodrx.com tiene cupones para medicamentos de diferentes farmacias. Los precios aqu no tienen en cuenta lo que podra costar con la ayuda del seguro (puede ser ms barato con su seguro), pero el sitio web puede darle el precio si no utiliz ningn seguro.  - Puede imprimir el cupn correspondiente y llevarlo con su receta a la farmacia.  - Tambin puede pasar por nuestra oficina durante el horario de atencin regular y recoger una tarjeta de cupones de GoodRx.  - Si necesita que su receta se enve electrnicamente a una farmacia diferente, informe a nuestra oficina a travs de MyChart de [place] o por telfono llamando al [phone] y presione la opcin 4.  

## 2022-07-16 ENCOUNTER — Ambulatory Visit (INDEPENDENT_AMBULATORY_CARE_PROVIDER_SITE_OTHER): Payer: Medicare HMO | Admitting: Dermatology

## 2022-07-16 DIAGNOSIS — Z4802 Encounter for removal of sutures: Secondary | ICD-10-CM

## 2022-07-16 DIAGNOSIS — Z85820 Personal history of malignant melanoma of skin: Secondary | ICD-10-CM

## 2022-07-16 NOTE — Progress Notes (Signed)
   Follow-Up Visit   Subjective  Stacy Hardy is a 67 y.o. female who presents for the following: Suture removal (Pathology proven margins free MM R forearm, patient here today for suture removal).  The following portions of the chart were reviewed this encounter and updated as appropriate:   Allergies  Meds  Problems  Med Hx  Surg Hx  Fam Hx     Review of Systems:  No other skin or systemic complaints except as noted in HPI or Assessment and Plan.  Objective  Well appearing patient in no apparent distress; mood and affect are within normal limits.  A focused examination was performed including the face and right forearm. Relevant physical exam findings are noted in the Assessment and Plan.  R forearm Healing excision site.    Assessment & Plan  History of melanoma R forearm  Encounter for Removal of Sutures - Incision site at the R forearm is clean, dry and intact. - Wound cleansed, sutures removed, wound cleansed and steri strips applied.  - Discussed pathology results showing margins free malignant melanoma.   - Patient advised to keep steri-strips dry until they fall off. - Scars remodel for a full year. - Once steri-strips fall off, patient can apply over-the-counter silicone scar cream each night to help with scar remodeling if desired. - Patient advised to call with any concerns or if they notice any new or changing lesions.    Return in about 3 months (around 10/15/2022) for TBSE.  Luther Redo, CMA, am acting as scribe for Sarina Ser, MD . Documentation: I have reviewed the above documentation for accuracy and completeness, and I agree with the above.  Sarina Ser, MD

## 2022-07-16 NOTE — Patient Instructions (Signed)
Due to recent changes in healthcare laws, you may see results of your pathology and/or laboratory studies on MyChart before the doctors have had a chance to review them. We understand that in some cases there may be results that are confusing or concerning to you. Please understand that not all results are received at the same time and often the doctors may need to interpret multiple results in order to provide you with the best plan of care or course of treatment. Therefore, we ask that you please give us 2 business days to thoroughly review all your results before contacting the office for clarification. Should we see a critical lab result, you will be contacted sooner.   If You Need Anything After Your Visit  If you have any questions or concerns for your doctor, please call our main line at [phone] and press option 4 to reach your doctor's medical assistant. If no one answers, please leave a voicemail as directed and we will return your call as soon as possible. Messages left after 4 pm will be answered the following business day.   You may also send us a message via MyChart. We typically respond to MyChart messages within 1-2 business days.  For prescription refills, please ask your pharmacy to contact our office. Our fax number is [phone].  If you have an urgent issue when the clinic is closed that cannot wait until the next business day, you can page your doctor at the number below.    Please note that while we do our best to be available for urgent issues outside of office hours, we are not available 24/7.   If you have an urgent issue and are unable to reach us, you may choose to seek medical care at your doctor's office, retail clinic, urgent care center, or emergency room.  If you have a medical emergency, please immediately call 911 or go to the emergency department.  Pager Numbers  - Dr. Kowalski: [phone]  - Dr. Moye: [phone]  - Dr. Stewart:  [phone]  In the event of inclement weather, please call our main line at [phone] for an update on the status of any delays or closures.  Dermatology Medication Tips: Please keep the boxes that topical medications come in in order to help keep track of the instructions about where and how to use these. Pharmacies typically print the medication instructions only on the boxes and not directly on the medication tubes.   If your medication is too expensive, please contact our office at [phone] option 4 or send us a message through MyChart.   We are unable to tell what your co-pay for medications will be in advance as this is different depending on your insurance coverage. However, we may be able to find a substitute medication at lower cost or fill out paperwork to get insurance to cover a needed medication.   If a prior authorization is required to get your medication covered by your insurance company, please allow us 1-2 business days to complete this process.  Drug prices often vary depending on where the prescription is filled and some pharmacies may offer cheaper prices.  The website www.goodrx.com contains coupons for medications through different pharmacies. The prices here do not account for what the cost may be with help from insurance (it may be cheaper with your insurance), but the website can give you the price if you did not use any insurance.  - You can print the associated coupon and take it with   your prescription to the pharmacy.  - You may also stop by our office during regular business hours and pick up a GoodRx coupon card.  - If you need your prescription sent electronically to a different pharmacy, notify our office through [place] MyChart or by phone at [phone] option 4.     Si Usted Necesita Algo Despus de Su Visita  Tambin puede enviarnos un mensaje a travs de MyChart. Por lo general respondemos a los mensajes de MyChart en el transcurso de 1 a 2  das hbiles.  Para renovar recetas, por favor pida a su farmacia que se ponga en contacto con nuestra oficina. Nuestro nmero de fax es el [phone].  Si tiene un asunto urgente cuando la clnica est cerrada y que no puede esperar hasta el siguiente da hbil, puede llamar/localizar a su doctor(a) al nmero que aparece a continuacin.   Por favor, tenga en cuenta que aunque hacemos todo lo posible para estar disponibles para asuntos urgentes fuera del horario de oficina, no estamos disponibles las 24 horas del da, los 7 das de la semana.   Si tiene un problema urgente y no puede comunicarse con nosotros, puede optar por buscar atencin mdica  en el consultorio de su doctor(a), en una clnica privada, en un centro de atencin urgente o en una sala de emergencias.  Si tiene una emergencia mdica, por favor llame inmediatamente al 911 o vaya a la sala de emergencias.  Nmeros de bper  - Dr. Kowalski: [phone]  - Dra. Moye: [phone]  - Dra. Stewart: [phone]  En caso de inclemencias del tiempo, por favor llame a nuestra lnea principal al [phone] para una actualizacin sobre el estado de cualquier retraso o cierre.  Consejos para la medicacin en dermatologa: Por favor, guarde las cajas en las que vienen los medicamentos de uso tpico para ayudarle a seguir las instrucciones sobre dnde y cmo usarlos. Las farmacias generalmente imprimen las instrucciones del medicamento slo en las cajas y no directamente en los tubos del medicamento.   Si su medicamento es muy caro, por favor, pngase en contacto con nuestra oficina llamando al [phone] y presione la opcin 4 o envenos un mensaje a travs de MyChart.   No podemos decirle cul ser su copago por los medicamentos por adelantado ya que esto es diferente dependiendo de la cobertura de su seguro. Sin embargo, es posible que podamos encontrar un medicamento sustituto a menor costo o llenar un formulario para que el  seguro cubra el medicamento que se considera necesario.   Si se requiere una autorizacin previa para que su compaa de seguros cubra su medicamento, por favor permtanos de 1 a 2 das hbiles para completar este proceso.  Los precios de los medicamentos varan con frecuencia dependiendo del lugar de dnde se surte la receta y alguna farmacias pueden ofrecer precios ms baratos.  El sitio web www.goodrx.com tiene cupones para medicamentos de diferentes farmacias. Los precios aqu no tienen en cuenta lo que podra costar con la ayuda del seguro (puede ser ms barato con su seguro), pero el sitio web puede darle el precio si no utiliz ningn seguro.  - Puede imprimir el cupn correspondiente y llevarlo con su receta a la farmacia.  - Tambin puede pasar por nuestra oficina durante el horario de atencin regular y recoger una tarjeta de cupones de GoodRx.  - Si necesita que su receta se enve electrnicamente a una farmacia diferente, informe a nuestra oficina a travs de MyChart de [place]   o por telfono llamando al [phone] y presione la opcin 4.  

## 2022-07-23 ENCOUNTER — Encounter: Payer: Medicare HMO | Admitting: Dermatology

## 2022-07-24 ENCOUNTER — Encounter: Payer: Self-pay | Admitting: Dermatology

## 2022-08-20 ENCOUNTER — Ambulatory Visit: Payer: Medicare HMO | Admitting: Dermatology

## 2022-08-21 ENCOUNTER — Ambulatory Visit: Payer: Medicare HMO | Admitting: Dermatology

## 2022-08-21 VITALS — BP 147/98 | HR 96

## 2022-08-21 DIAGNOSIS — L578 Other skin changes due to chronic exposure to nonionizing radiation: Secondary | ICD-10-CM

## 2022-08-21 DIAGNOSIS — D2339 Other benign neoplasm of skin of other parts of face: Secondary | ICD-10-CM | POA: Diagnosis not present

## 2022-08-21 DIAGNOSIS — D239 Other benign neoplasm of skin, unspecified: Secondary | ICD-10-CM

## 2022-08-21 NOTE — Patient Instructions (Addendum)
Recommend taking Heliocare sun protection supplement daily in sunny weather for additional sun protection. For maximum protection on the sunniest days, you can take up to 2 capsules of regular Heliocare OR take 1 capsule of Heliocare Ultra. For prolonged exposure (such as a full day in the sun), you can repeat your dose of the supplement 4 hours after your first dose. Heliocare can be purchased at [employer], at some [facility] or at [email or web address].     Melanoma ABCDEs  Melanoma is the most dangerous type of skin cancer, and is the leading cause of death from skin disease.  You are more likely to develop melanoma if you: Have light-colored skin, light-colored eyes, or red or blond hair Spend a lot of time in the sun Tan regularly, either outdoors or in a tanning bed Have had blistering sunburns, especially during childhood Have a close family member who has had a melanoma Have atypical moles or large birthmarks  Early detection of melanoma is key since treatment is typically straightforward and cure rates are extremely high if we catch it early.   The first sign of melanoma is often a change in a mole or a new dark spot.  The ABCDE system is a way of remembering the signs of melanoma.  A for asymmetry:  The two halves do not match. B for border:  The edges of the growth are irregular. C for color:  A mixture of colors are present instead of an even brown color. D for diameter:  Melanomas are usually (but not always) greater than 5m - the size of a pencil eraser. E for evolution:  The spot keeps changing in size, shape, and color.  Please check your skin once per month between visits. You can use a small mirror in front and a large mirror behind you to keep an eye on the back side or your body.   If you see any new or changing lesions before your next follow-up, please call to schedule a visit.  Please continue daily skin protection including broad spectrum sunscreen SPF 30+ to  sun-exposed areas, reapplying every 2 hours as needed when you're outdoors.   Staying in the shade or wearing long sleeves, sun glasses (UVA+UVB protection) and wide brim hats (4-inch brim around the entire circumference of the hat) are also recommended for sun protection.        Due to recent changes in healthcare laws, you may see results of your pathology and/or laboratory studies on MyChart before the doctors have had a chance to review them. We understand that in some cases there may be results that are confusing or concerning to you. Please understand that not all results are received at the same time and often the doctors may need to interpret multiple results in order to provide you with the best plan of care or course of treatment. Therefore, we ask that you please give u[place]2 business days to thoroughly review all your results before contacting the office for clarification. Should we see a critical lab result, you will be contacted sooner.   If You Need Anything After Your Visit  If you have any questions or concerns for your doctor, please call our main line at [phone] press option 4 to reach your doctor's medical assistant. If no one answers, please leave a voicemail as directed and we will return your call as soon as possible. Messages left after 4 pm will be answered the following business day.   You may  also send [place] a message via MyChart. We typically respond to MyChart messages within 1-2 business days.  For prescription refills, please ask your pharmacy to contact our office. Our fax number is [phone].  If you have an urgent issue when the clinic is closed that cannot wait until the next business day, you can page your doctor at the number below.    Please note that while we do our best to be available for urgent issues outside of office hours, we are not available 24/7.   If you have an urgent issue and are unable to reach [place], you may choose to seek medical care  at your doctor's office, retail clinic, urgent care center, or emergency room.  If you have a medical emergency, please immediately call 911 or go to the emergency department.  Pager Numbers  - Dr. Nehemiah Massed: [phone]  - Dr. Laurence Ferrari: [phone]  - Dr. Nicole Kindred: [phone]  In the event of inclement weather, please call our main line at [phone] for an update on the status of any delays or closures.  Dermatology Medication Tips: Please keep the boxes that topical medications come in in order to help keep track of the instructions about where and how to use these. Pharmacies typically print the medication instructions only on the boxes and not directly on the medication tubes.   If your medication is too expensive, please contact our office at [phone] option 4 or send [place] a message through [place].   We are unable to tell what your co-pay for medications will be in advance as this is different depending on your insurance coverage. However, we may be able to find a substitute medication at lower cost or fill out paperwork to get insurance to cover a needed medication.   If a prior authorization is required to get your medication covered by your insurance company, please allow [place] 1-2 business days to complete this process.  Drug prices often vary depending on where the prescription is filled and some pharmacies may offer cheaper prices.  The website www.goodrx.com contains coupons for medications through different pharmacies. The prices here do not account for what the cost may be with help from insurance (it may be cheaper with your insurance), but the website can give you the price if you did not use any insurance.  - You can print the associated coupon and take it with your prescription to the pharmacy.  - You may also stop by our office during regular business hours and pick up a GoodRx coupon card.  - If you need your prescription sent electronically to a different pharmacy,  notify our office through [facility] or by phone at [phone] option 4.     Si Usted Necesita Algo Despus de Su Visita  Tambin puede enviarnos un mensaje a travs de Pharmacist, community. Por lo general respondemos a los mensajes de MyChart en el transcurso de 1 a 2 das hbiles.  Para renovar recetas, por favor pida a su farmacia que se ponga en contacto con nuestra oficina. Harland Dingwall de fax es [place] [phone].  Si tiene un asunto urgente cuando la clnica est cerrada y que no puede esperar hasta el siguiente da hbil, puede llamar/localizar a su doctor(a) al nmero que aparece a continuacin.   Por favor, tenga en cuenta que aunque hacemos todo lo posible para estar disponibles para asuntos urgentes fuera del horario de [place], no estamos disponibles las 24 horas del da, los 7 das de la [place].   Si  tiene un problema urgente y no puede comunicarse con nosotros, puede optar por buscar atencin mdica  en el consultorio de su doctor(a), en una clnica privada, en un centro de atencin urgente o en una sala de emergencias.  Si tiene Engineering geologist, por favor llame inmediatamente al 911 o vaya a la sala de emergencias.  Nmeros de bper  - Dr. Nehemiah Massed: [phone]  - Dra. Moye: [phone]  - Dra. Nicole Kindred: [phone]  En caso de inclemencias del [place], por favor llame a Johnsie Kindred principal al [phone] para una actualizacin sobre el [place] de cualquier retraso o cierre.  Consejos para la medicacin en dermatologa: Por favor, guarde las cajas en las que vienen los medicamentos de uso tpico para ayudarle a seguir las instrucciones sobre dnde y cmo usarlos. Las farmacias generalmente imprimen las instrucciones del medicamento slo en las cajas y no directamente en los tubos del [place].   Si su medicamento es muy caro, por favor, pngase en contacto con Zigmund Daniel llamando al [phone] y presione la opcin 4 o envenos un mensaje a travs de  Pharmacist, community.   No podemos decirle cul ser su copago por los medicamentos por adelantado ya que esto es diferente dependiendo de la cobertura de su seguro. Sin embargo, es posible que podamos encontrar un medicamento sustituto a Electrical engineer un formulario para que el seguro cubra el medicamento que se considera necesario.   Si se requiere una autorizacin previa para que su compaa de seguros [place] su medicamento, por favor permtanos de 1 a 2 das hbiles para completar este proceso.  Los precios de los medicamentos varan con frecuencia dependiendo del Environmental consultant de dnde se surte la receta y alguna farmacias pueden ofrecer precios ms baratos.  El sitio web www.goodrx.com tiene cupones para medicamentos de Airline pilot. Los precios aqu no tienen en cuenta lo que podra costar con la ayuda del seguro (puede ser ms barato con su seguro), pero el sitio web puede darle el precio si no utiliz Research scientist (physical sciences).  - Puede imprimir el cupn correspondiente y llevarlo con su receta a la farmacia.  - Tambin puede pasar por nuestra oficina durante el horario de atencin regular y Charity fundraiser una tarjeta de cupones de GoodRx.  - Si necesita que su receta se enve electrnicamente a una farmacia diferente, informe a nuestra oficina a travs de MyChart de [place] o por telfono llamando al [phone] y presione la opcin 4.

## 2022-08-21 NOTE — Progress Notes (Signed)
   Follow-Up Visit   Subjective  Stacy Hardy is a 67 y.o. female who presents for the following: OTHER (Patient concerned with spot at left side of nose/cheek. Noticed a few months ago. ).  The patient has spots, moles and lesions to be evaluated, some may be new or changing and the patient has concerns that these could be cancer.   The following portions of the chart were reviewed this encounter and updated as appropriate:  Allergies  Meds  Problems  Med Hx  Surg Hx  Fam Hx      Review of Systems: No other skin or systemic complaints except as noted in HPI or Assessment and Plan.   Objective  Well appearing patient in no apparent distress; mood and affect are within normal limits.  A focused examination was performed including left nasaofacial angle. Relevant physical exam findings are noted in the Assessment and Plan.  left nasofacial angle Dilated pore   Assessment & Plan  Dilated pore of [place] left nasofacial angle  With superficial cyst  Benign-appearing.  Observation.  Call clinic for new or changing lesions.     Actinic Damage - chronic, secondary to cumulative UV radiation exposure/sun exposure over time - diffuse scaly erythematous macules with underlying dyspigmentation - Recommend daily broad spectrum sunscreen SPF 30+ to sun-exposed areas, reapply every 2 hours as needed.  - Recommend staying in the shade or wearing long sleeves, sun glasses (UVA+UVB protection) and wide brim hats (4-inch brim around the entire circumference of the hat). - Call for new or changing lesions. - Recommend taking Heliocare sun protection supplement daily in sunny weather for additional sun protection. For maximum protection on the sunniest days, you can take up to 2 capsules of regular Heliocare OR take 1 capsule of Heliocare Ultra. For prolonged exposure (such as a full day in the sun), you can repeat your dose of the supplement 4 hours after your first dose.   Return for  keep april appointment as scheduled .  I, Ruthell Rummage, CMA, am acting as scribe for Forest Gleason, MD.  Documentation: I have reviewed the above documentation for accuracy and completeness, and I agree with the above.  Forest Gleason, MD

## 2022-08-23 ENCOUNTER — Encounter: Payer: Self-pay | Admitting: Dermatology

## 2022-10-03 ENCOUNTER — Ambulatory Visit: Payer: Medicare HMO | Admitting: Dermatology

## 2022-10-03 ENCOUNTER — Encounter: Payer: Self-pay | Admitting: Dermatology

## 2022-10-03 VITALS — BP 150/86

## 2022-10-03 DIAGNOSIS — L821 Other seborrheic keratosis: Secondary | ICD-10-CM

## 2022-10-03 DIAGNOSIS — Z85820 Personal history of malignant melanoma of skin: Secondary | ICD-10-CM | POA: Diagnosis not present

## 2022-10-03 DIAGNOSIS — L578 Other skin changes due to chronic exposure to nonionizing radiation: Secondary | ICD-10-CM | POA: Diagnosis not present

## 2022-10-03 DIAGNOSIS — L814 Other melanin hyperpigmentation: Secondary | ICD-10-CM | POA: Diagnosis not present

## 2022-10-03 DIAGNOSIS — L820 Inflamed seborrheic keratosis: Secondary | ICD-10-CM

## 2022-10-03 DIAGNOSIS — D229 Melanocytic nevi, unspecified: Secondary | ICD-10-CM

## 2022-10-03 DIAGNOSIS — D1801 Hemangioma of skin and subcutaneous tissue: Secondary | ICD-10-CM

## 2022-10-03 DIAGNOSIS — Z1283 Encounter for screening for malignant neoplasm of skin: Secondary | ICD-10-CM

## 2022-10-03 NOTE — Patient Instructions (Addendum)

## 2022-10-03 NOTE — Progress Notes (Signed)
   Follow-Up Visit   Subjective  BEIJA BASICH is a 67 y.o. female who presents for the following: Skin Cancer Screening and Full Body Skin Exam, hx of Melanoma R forearm  The patient presents for Total-Body Skin Exam (TBSE) for skin cancer screening and mole check. The patient has spots, moles and lesions to be evaluated, some may be new or changing and the patient has concerns that these could be cancer.    The following portions of the chart were reviewed this encounter and updated as appropriate: medications, allergies, medical history  Review of Systems:  No other skin or systemic complaints except as noted in HPI or Assessment and Plan.  Objective  Well appearing patient in no apparent distress; mood and affect are within normal limits.  A full examination was performed including scalp, head, eyes, ears, nose, lips, neck, chest, axillae, abdomen, back, buttocks, bilateral upper extremities, bilateral lower extremities, hands, feet, fingers, toes, fingernails, and toenails. All findings within normal limits unless otherwise noted below.   Relevant physical exam findings are noted in the Assessment and Plan.    Assessment & Plan   LENTIGINES, SEBORRHEIC KERATOSES, HEMANGIOMAS - Benign normal skin lesions - Benign-appearing - Call for any changes  MELANOCYTIC NEVI - Tan-brown and/or pink-flesh-colored symmetric macules and papules - Benign appearing on exam today - Observation - Call clinic for new or changing moles - Recommend daily use of broad spectrum spf 30+ sunscreen to sun-exposed areas.   ACTINIC DAMAGE - Chronic condition, secondary to cumulative UV/sun exposure - diffuse scaly erythematous macules with underlying dyspigmentation - Recommend daily broad spectrum sunscreen SPF 30+ to sun-exposed areas, reapply every 2 hours as needed.  - Staying in the shade or wearing long sleeves, sun glasses (UVA+UVB protection) and wide brim hats (4-inch brim around the entire  circumference of the hat) are also recommended for sun protection.  - Call for new or changing lesions.  SKIN CANCER SCREENING PERFORMED TODAY.  HISTORY OF MELANOMA BRESLOW'S 0.2 MM CLARK/ANATOMIC LEVEL: II, Excised 07/09/2022, [facility] 1A - No evidence of recurrence today with exam and palpation - No lymphadenopathy - No hx of swelling of hand - Recommend regular full body skin exams - Recommend daily broad spectrum sunscreen SPF 30+ to sun-exposed areas, reapply every 2 hours as needed.  - Call if any new or changing lesions are noted between office visits  - R forearm   INFLAMED SEBORRHEIC KERATOSIS Exam: Erythematous keratotic or waxy stuck-on papule or plaque.  Symptomatic, irritating, patient would like treated.  Benign-appearing.  Call clinic for new or changing lesions.   Prior to procedure, discussed risks of blister formation, small wound, skin dyspigmentation, or rare scar following treatment. Recommend Vaseline ointment to treated areas while healing.  Destruction Procedure Note Destruction method: cryotherapy   Informed consent: discussed and consent obtained   Lesion destroyed using liquid nitrogen: Yes   Outcome: patient tolerated procedure well with no complications   Post-procedure details: wound care instructions given   Locations: L ant thigh above the knee x 1, R lat breast x 1 # of Lesions Treated: 2   Return in about 3 months (around 01/02/2023) for TBSE, Hx of Melanoma.  I, Othelia Pulling, RMA, am acting as scribe for Sarina Ser, MD .   Documentation: I have reviewed the above documentation for accuracy and completeness, and I agree with the above.  Sarina Ser, MD

## 2022-10-08 ENCOUNTER — Encounter: Payer: Self-pay | Admitting: Dermatology

## 2022-11-20 ENCOUNTER — Other Ambulatory Visit: Payer: Self-pay | Admitting: Pediatrics

## 2022-11-20 DIAGNOSIS — R140 Abdominal distension (gaseous): Secondary | ICD-10-CM

## 2022-11-20 DIAGNOSIS — R7989 Other specified abnormal findings of blood chemistry: Secondary | ICD-10-CM

## 2022-11-28 ENCOUNTER — Ambulatory Visit
Admission: RE | Admit: 2022-11-28 | Discharge: 2022-11-28 | Disposition: A | Discharge status: Home / Self Care | Payer: Medicare HMO | Source: Ambulatory Visit | Attending: Pediatrics | Admitting: Pediatrics

## 2022-11-28 DIAGNOSIS — R7989 Other specified abnormal findings of blood chemistry: Secondary | ICD-10-CM | POA: Diagnosis present

## 2022-11-28 DIAGNOSIS — R140 Abdominal distension (gaseous): Secondary | ICD-10-CM | POA: Diagnosis present

## 2022-12-04 ENCOUNTER — Ambulatory Visit: Payer: Medicare HMO

## 2022-12-04 DIAGNOSIS — K641 Second degree hemorrhoids: Secondary | ICD-10-CM

## 2022-12-04 DIAGNOSIS — D122 Benign neoplasm of ascending colon: Secondary | ICD-10-CM

## 2022-12-04 DIAGNOSIS — Z1211 Encounter for screening for malignant neoplasm of colon: Secondary | ICD-10-CM

## 2022-12-04 DIAGNOSIS — D120 Benign neoplasm of cecum: Secondary | ICD-10-CM

## 2022-12-04 DIAGNOSIS — K635 Polyp of colon: Secondary | ICD-10-CM | POA: Diagnosis not present

## 2023-01-23 ENCOUNTER — Ambulatory Visit: Payer: Medicare HMO | Admitting: Dermatology

## 2023-01-23 VITALS — BP 137/87

## 2023-01-23 DIAGNOSIS — D225 Melanocytic nevi of trunk: Secondary | ICD-10-CM

## 2023-01-23 DIAGNOSIS — D692 Other nonthrombocytopenic purpura: Secondary | ICD-10-CM

## 2023-01-23 DIAGNOSIS — Z85820 Personal history of malignant melanoma of skin: Secondary | ICD-10-CM

## 2023-01-23 DIAGNOSIS — L821 Other seborrheic keratosis: Secondary | ICD-10-CM

## 2023-01-23 DIAGNOSIS — L578 Other skin changes due to chronic exposure to nonionizing radiation: Secondary | ICD-10-CM | POA: Diagnosis not present

## 2023-01-23 DIAGNOSIS — W908XXA Exposure to other nonionizing radiation, initial encounter: Secondary | ICD-10-CM | POA: Diagnosis not present

## 2023-01-23 DIAGNOSIS — D229 Melanocytic nevi, unspecified: Secondary | ICD-10-CM

## 2023-01-23 DIAGNOSIS — L814 Other melanin hyperpigmentation: Secondary | ICD-10-CM | POA: Diagnosis not present

## 2023-01-23 DIAGNOSIS — D239 Other benign neoplasm of skin, unspecified: Secondary | ICD-10-CM

## 2023-01-23 DIAGNOSIS — Z1283 Encounter for screening for malignant neoplasm of skin: Secondary | ICD-10-CM

## 2023-01-23 DIAGNOSIS — D492 Neoplasm of unspecified behavior of bone, soft tissue, and skin: Secondary | ICD-10-CM

## 2023-01-23 DIAGNOSIS — D1801 Hemangioma of skin and subcutaneous tissue: Secondary | ICD-10-CM

## 2023-01-23 DIAGNOSIS — L729 Follicular cyst of the skin and subcutaneous tissue, unspecified: Secondary | ICD-10-CM

## 2023-01-23 DIAGNOSIS — L820 Inflamed seborrheic keratosis: Secondary | ICD-10-CM

## 2023-01-23 DIAGNOSIS — I781 Nevus, non-neoplastic: Secondary | ICD-10-CM

## 2023-01-23 DIAGNOSIS — L720 Epidermal cyst: Secondary | ICD-10-CM

## 2023-01-23 HISTORY — DX: Other benign neoplasm of skin, unspecified: D23.9

## 2023-01-23 NOTE — Progress Notes (Signed)
Est. Patient Visit   Subjective  Stacy Hardy is a 67 y.o. female who presents for the following: Skin Cancer Screening and Full Body Skin Exam, hx of Melanoma, check spot nasal tip ~24yr The patient presents for Total-Body Skin Exam (TBSE) for skin cancer screening and mole check. The patient has spots, moles and lesions to be evaluated, some may be new or changing and the patient may have concern these could be cancer.  The following portions of the chart were reviewed this encounter and updated as appropriate: medications, allergies, medical history  Review of Systems:  No other skin or systemic complaints except as noted in HPI or Assessment and Plan.  Objective  Well appearing patient in no apparent distress; mood and affect are within normal limits.  A full examination was performed including scalp, head, eyes, ears, nose, lips, neck, chest, axillae, abdomen, back, buttocks, bilateral upper extremities, bilateral lower extremities, hands, feet, fingers, toes, fingernails, and toenails. All findings within normal limits unless otherwise noted below.   Relevant physical exam findings are noted in the Assessment and Plan.  L oral commissure x 1, back x 2, R thigh x 1 (4) Stuck on waxy paps with erythema  R mid back under braline paraspinal 0.7 irregular brown macule       R mid back under bra line 3.0cm lat to spine 0.7cm irregular brown macule        Assessment & Plan   SKIN CANCER SCREENING PERFORMED TODAY.  ACTINIC DAMAGE - Chronic condition, secondary to cumulative UV/sun exposure - diffuse scaly erythematous macules with underlying dyspigmentation - Recommend daily broad spectrum sunscreen SPF 30+ to sun-exposed areas, reapply every 2 hours as needed.  - Staying in the shade or wearing long sleeves, sun glasses (UVA+UVB protection) and wide brim hats (4-inch brim around the entire circumference of the hat) are also recommended for sun protection.  - Call for  new or changing lesions.  LENTIGINES, SEBORRHEIC KERATOSES, HEMANGIOMAS - Benign normal skin lesions - Benign-appearing - Call for any changes  MELANOCYTIC NEVI - Tan-brown and/or pink-flesh-colored symmetric macules and papules - Benign appearing on exam today - Observation - Call clinic for new or changing moles - Recommend daily use of broad spectrum spf 30+ sunscreen to sun-exposed areas.   HISTORY OF MELANOMA - No evidence of recurrence today - No lymphadenopathy - Recommend regular full body skin exams - Recommend daily broad spectrum sunscreen SPF 30+ to sun-exposed areas, reapply every 2 hours as needed.  - Call if any new or changing lesions are noted between office visits  - R forearm - BRESLOW'S 0.2 MM CLARK/ANATOMIC LEVEL: II, Excised 07/09/2022, [facility] 1A   Milia - tiny firm white papules - type of cyst - benign - may be extracted if symptomatic - observe  - face  Inflamed seborrheic keratosis (4) L oral commissure x 1, back x 2, R thigh x 1  Symptomatic, irritating, patient would like treated.   Destruction of lesion - L oral commissure x 1, back x 2, R thigh x 1 (4) Complexity: simple   Destruction method: cryotherapy   Informed consent: discussed and consent obtained   Timeout:  patient name, date of birth, surgical site, and procedure verified Lesion destroyed using liquid nitrogen: Yes   Region frozen until ice ball extended beyond lesion: Yes   Outcome: patient tolerated procedure well with no complications   Post-procedure details: wound care instructions given    Neoplasm of skin (2) R mid  back under braline paraspinal  Epidermal / dermal shaving  Lesion diameter (cm):  0.7 Informed consent: discussed and consent obtained   Timeout: patient name, date of birth, surgical site, and procedure verified   Procedure prep:  Patient was prepped and draped in usual sterile fashion Prep type:  Isopropyl alcohol Anesthesia: the lesion was  anesthetized in a standard fashion   Anesthetic:  1% lidocaine w/ epinephrine 1-100,000 buffered w/ 8.4% NaHCO3 Instrument used: flexible razor blade   Hemostasis achieved with: pressure, aluminum chloride and electrodesiccation   Outcome: patient tolerated procedure well   Post-procedure details: sterile dressing applied and wound care instructions given   Dressing type: bandage and bacitracin    Specimen 1 - Surgical pathology Differential Diagnosis: D48.5 Nevus vs Dysplastic Nevus  Check Margins: yes 0.7 irregular brown macule  R mid back under bra line 3.0cm lat to spine  Epidermal / dermal shaving  Lesion diameter (cm):  0.7 Informed consent: discussed and consent obtained   Timeout: patient name, date of birth, surgical site, and procedure verified   Procedure prep:  Patient was prepped and draped in usual sterile fashion Prep type:  Isopropyl alcohol Anesthesia: the lesion was anesthetized in a standard fashion   Anesthetic:  1% lidocaine w/ epinephrine 1-100,000 buffered w/ 8.4% NaHCO3 Instrument used: flexible razor blade   Hemostasis achieved with: pressure, aluminum chloride and electrodesiccation   Outcome: patient tolerated procedure well   Post-procedure details: sterile dressing applied and wound care instructions given   Dressing type: bandage and bacitracin    Specimen 2 - Surgical pathology Differential Diagnosis: D48.5 Nevus vs Dysplastic Nevus  Check Margins: yes 0.7cm irregular brown macule  Skin cancer screening  Actinic skin damage  History of malignant melanoma  Lentigo  Melanocytic nevus, unspecified location  Cyst of skin  Purpura (HCC)  Telangiectasia   Purpura - Chronic; persistent and recurrent.  Treatable, but not curable. - Violaceous macules and patches - Benign - Related to trauma, age, sun damage and/or use of blood thinners, chronic use of topical and/or oral steroids - Observe - Can use OTC arnica containing moisturizer  such as Dermend Bruise Formula if desired - Call for worsening or other concerns   TELANGIECTASIA Exam: dilated blood vessel R nasal tip  Treatment Plan: Benign appearing on exam Call for changes   Return in about 3 months (around 04/25/2023) for TBSE, Hx of Melanoma.  I, Ardis Rowan, RMA, am acting as scribe for Armida Sans, MD .  Documentation: I have reviewed the above documentation for accuracy and completeness, and I agree with the above.  Armida Sans, MD

## 2023-01-23 NOTE — Patient Instructions (Addendum)
Wound Care Instructions  Cleanse wound gently with soap and water once a day then pat dry with clean gauze. Apply a thin coat of Petrolatum (petroleum jelly, "Vaseline") over the wound (unless you have an allergy to this). We recommend that you use a new, sterile tube of Vaseline. Do not pick or remove scabs. Do not remove the yellow or white "healing tissue" from the base of the wound.  Cover the wound with fresh, clean, nonstick gauze and secure with paper tape. You may use Band-Aids in place of gauze and tape if the wound is small enough, but would recommend trimming much of the tape off as there is often too much. Sometimes Band-Aids can irritate the skin.  You should call the office for your biopsy report after 1 week if you have not already been contacted.  If you experience any problems, such as abnormal amounts of bleeding, swelling, significant bruising, significant pain, or evidence of infection, please call the office immediately.  FOR ADULT SURGERY PATIENTS: If you need something for pain relief you may take 1 extra strength Tylenol (acetaminophen) AND 2 Ibuprofen (200mg each) together every 4 hours as needed for pain. (do not take these if you are allergic to them or if you have a reason you should not take them.) Typically, you may only need pain medication for 1 to 3 days.     Due to recent changes in healthcare laws, you may see results of your pathology and/or laboratory studies on MyChart before the doctors have had a chance to review them. We understand that in some cases there may be results that are confusing or concerning to you. Please understand that not all results are received at the same time and often the doctors may need to interpret multiple results in order to provide you with the best plan of care or course of treatment. Therefore, we ask that you please give us 2 business days to thoroughly review all your results before contacting the office for clarification. Should  we see a critical lab result, you will be contacted sooner.   If You Need Anything After Your Visit  If you have any questions or concerns for your doctor, please call our main line at [phone] and press option 4 to reach your doctor's medical assistant. If no one answers, please leave a voicemail as directed and we will return your call as soon as possible. Messages left after 4 pm will be answered the following business day.   You may also send us a message via MyChart. We typically respond to MyChart messages within 1-2 business days.  For prescription refills, please ask your pharmacy to contact our office. Our fax number is [phone].  If you have an urgent issue when the clinic is closed that cannot wait until the next business day, you can page your doctor at the number below.    Please note that while we do our best to be available for urgent issues outside of office hours, we are not available 24/7.   If you have an urgent issue and are unable to reach us, you may choose to seek medical care at your doctor's office, retail clinic, urgent care center, or emergency room.  If you have a medical emergency, please immediately call 911 or go to the emergency department.  Pager Numbers  - Dr. Kowalski: [phone]  - Dr. Moye: [phone]  - Dr. Stewart: [phone]  In the event of inclement weather, please call our main line at   [phone] for an update on the status of any delays or closures.  Dermatology Medication Tips: Please keep the boxes that topical medications come in in order to help keep track of the instructions about where and how to use these. Pharmacies typically print the medication instructions only on the boxes and not directly on the medication tubes.   If your medication is too expensive, please contact our office at [phone] option 4 or send us a message through MyChart.   We are unable to tell what your co-pay for medications will be in  advance as this is different depending on your insurance coverage. However, we may be able to find a substitute medication at lower cost or fill out paperwork to get insurance to cover a needed medication.   If a prior authorization is required to get your medication covered by your insurance company, please allow us 1-2 business days to complete this process.  Drug prices often vary depending on where the prescription is filled and some pharmacies may offer cheaper prices.  The website www.goodrx.com contains coupons for medications through different pharmacies. The prices here do not account for what the cost may be with help from insurance (it may be cheaper with your insurance), but the website can give you the price if you did not use any insurance.  - You can print the associated coupon and take it with your prescription to the pharmacy.  - You may also stop by our office during regular business hours and pick up a GoodRx coupon card.  - If you need your prescription sent electronically to a different pharmacy, notify our office through [facility] MyChart or by phone at [phone] option 4.     Si Usted Necesita Algo Despus de Su Visita  Tambin puede enviarnos un mensaje a travs de MyChart. Por lo general respondemos a los mensajes de MyChart en el transcurso de 1 a 2 das hbiles.  Para renovar recetas, por favor pida a su farmacia que se ponga en contacto con nuestra oficina. Nuestro nmero de fax es el [phone].  Si tiene un asunto urgente cuando la clnica est cerrada y que no puede esperar hasta el siguiente da hbil, puede llamar/localizar a su doctor(a) al nmero que aparece a continuacin.   Por favor, tenga en cuenta que aunque hacemos todo lo posible para estar disponibles para asuntos urgentes fuera del horario de oficina, no estamos disponibles las 24 horas del da, los 7 das de la semana.   Si tiene un problema urgente y no puede comunicarse con nosotros, puede  optar por buscar atencin mdica  en el consultorio de su doctor(a), en una clnica privada, en un centro de atencin urgente o en una sala de emergencias.  Si tiene una emergencia mdica, por favor llame inmediatamente al 911 o vaya a la sala de emergencias.  Nmeros de bper  - Dr. Kowalski: [phone]  - Dra. Moye: [phone]  - Dra. Stewart: [phone]  En caso de inclemencias del tiempo, por favor llame a nuestra lnea principal al [phone] para una actualizacin sobre el estado de cualquier retraso o cierre.  Consejos para la medicacin en dermatologa: Por favor, guarde las cajas en las que vienen los medicamentos de uso tpico para ayudarle a seguir las instrucciones sobre dnde y cmo usarlos. Las farmacias generalmente imprimen las instrucciones del medicamento slo en las cajas y no directamente en los tubos del medicamento.   Si su medicamento es muy caro, por favor, pngase en contacto con   nuestra oficina llamando al [phone] y presione la opcin 4 o envenos un mensaje a travs de MyChart.   No podemos decirle cul ser su copago por los medicamentos por adelantado ya que esto es diferente dependiendo de la cobertura de su seguro. Sin embargo, es posible que podamos encontrar un medicamento sustituto a menor costo o llenar un formulario para que el seguro cubra el medicamento que se considera necesario.   Si se requiere una autorizacin previa para que su compaa de seguros cubra su medicamento, por favor permtanos de 1 a 2 das hbiles para completar este proceso.  Los precios de los medicamentos varan con frecuencia dependiendo del lugar de dnde se surte la receta y alguna farmacias pueden ofrecer precios ms baratos.  El sitio web www.goodrx.com tiene cupones para medicamentos de diferentes farmacias. Los precios aqu no tienen en cuenta lo que podra costar con la ayuda del seguro (puede ser ms barato con su seguro), pero el sitio web puede darle el  precio si no utiliz ningn seguro.  - Puede imprimir el cupn correspondiente y llevarlo con su receta a la farmacia.  - Tambin puede pasar por nuestra oficina durante el horario de atencin regular y recoger una tarjeta de cupones de GoodRx.  - Si necesita que su receta se enve electrnicamente a una farmacia diferente, informe a nuestra oficina a travs de MyChart de [place] o por telfono llamando al [phone] y presione la opcin 4.  

## 2023-01-27 ENCOUNTER — Encounter: Payer: Self-pay | Admitting: Dermatology

## 2023-02-04 ENCOUNTER — Telehealth: Payer: Self-pay

## 2023-02-04 NOTE — Telephone Encounter (Addendum)
Called and discussed results with patient. She verbalized understanding. Will recheck at patient's next follow up.    ----- Message from Armida Sans sent at 01/30/2023  6:01 PM EDT ----- Diagnosis 1. 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 Skin , right mid back under bra line 3.0cm lat to spine DYSPLASTIC COMPOUND NEVUS WITH MILD ATYPIA  1-moderate dysplastic  recheck next visit 2-mild dysplastic Recheck next visit

## 2023-04-01 ENCOUNTER — Encounter: Payer: Self-pay | Admitting: Oncology

## 2023-04-01 ENCOUNTER — Inpatient Hospital Stay: Payer: Medicare HMO | Attending: Oncology | Admitting: Oncology

## 2023-04-01 ENCOUNTER — Inpatient Hospital Stay: Payer: Medicare HMO

## 2023-04-01 VITALS — BP 175/72 | HR 81 | Temp 97.6°F | Resp 18 | Ht 67.0 in | Wt 137.7 lb

## 2023-04-01 DIAGNOSIS — Z85820 Personal history of malignant melanoma of skin: Secondary | ICD-10-CM | POA: Diagnosis not present

## 2023-04-01 DIAGNOSIS — Z9889 Other specified postprocedural states: Secondary | ICD-10-CM | POA: Insufficient documentation

## 2023-04-01 DIAGNOSIS — F419 Anxiety disorder, unspecified: Secondary | ICD-10-CM | POA: Diagnosis not present

## 2023-04-01 DIAGNOSIS — Z85828 Personal history of other malignant neoplasm of skin: Secondary | ICD-10-CM | POA: Diagnosis not present

## 2023-04-01 DIAGNOSIS — D751 Secondary polycythemia: Secondary | ICD-10-CM | POA: Diagnosis present

## 2023-04-01 LAB — CBC WITH DIFFERENTIAL/PLATELET
Abs Immature Granulocytes: 0.01 10*3/uL (ref 0.00–0.07)
Basophils Absolute: 0.1 10*3/uL (ref 0.0–0.1)
Basophils Relative: 1 %
Eosinophils Absolute: 0.2 10*3/uL (ref 0.0–0.5)
Eosinophils Relative: 3 %
HCT: 45.4 % (ref 36.0–46.0)
Hemoglobin: 15.3 g/dL — ABNORMAL HIGH (ref 12.0–15.0)
Immature Granulocytes: 0 %
Lymphocytes Relative: 19 %
Lymphs Abs: 1 10*3/uL (ref 0.7–4.0)
MCH: 31.5 pg (ref 26.0–34.0)
MCHC: 33.7 g/dL (ref 30.0–36.0)
MCV: 93.6 fL (ref 80.0–100.0)
Monocytes Absolute: 0.3 10*3/uL (ref 0.1–1.0)
Monocytes Relative: 6 %
Neutro Abs: 3.7 10*3/uL (ref 1.7–7.7)
Neutrophils Relative %: 71 %
Platelets: 200 10*3/uL (ref 150–400)
RBC: 4.85 MIL/uL (ref 3.87–5.11)
RDW: 12.7 % (ref 11.5–15.5)
WBC: 5.2 10*3/uL (ref 4.0–10.5)
nRBC: 0 % (ref 0.0–0.2)

## 2023-04-01 NOTE — Progress Notes (Signed)
Hematology/Oncology Consult note [facility] Telephone:([phone] [phone]  Patient Care Team: Orene Desanctis, MD as PCP - General (Pediatrics) Creig Hines, MD as Consulting Physician (Oncology)   Name of the patient: Stacy Hardy  [id number]  26-Nov-1955    Reason for referral-polycythemia   Referring physician- Ardyth Man PA  Date of visit: 04/01/23   History of presenting illness- Patient is a 67 year old female with a past medical history significant for melanoma s/p excision and anxiety referred for polycythemia.  H&H in July 2024 was mildly elevated at 16.1/47.9.  Looking back at her prior CBCs her hemoglobin mostly ranges around 15.5.  CMP within normal limits.  TSH in July 2024 normal.  She is a non-smoker and does not have any evidence of chronic lung disease.  She gets a good night sleep for the most part and gets up in the morning feeling refreshed.  She reports symptoms of burning sensation especially in her extremities along with some tingling numbness in her feet which has been ongoing for the last few months.  She donates blood about 3-4 times a year.  States that her family including her mother and brother were also diagnosed with polycythemia.  ECOG PS- 0  Pain scale- 0   Review of systems- Review of Systems  Constitutional:  Negative for chills, fever, malaise/fatigue and weight loss.  HENT:  Negative for congestion, ear discharge and nosebleeds.   Eyes:  Negative for blurred vision.  Respiratory:  Negative for cough, hemoptysis, sputum production, shortness of breath and wheezing.   Cardiovascular:  Negative for chest pain, palpitations, orthopnea and claudication.  Gastrointestinal:  Negative for abdominal pain, blood in stool, constipation, diarrhea, heartburn, melena, nausea and vomiting.  Genitourinary:  Negative for dysuria, flank pain, frequency, hematuria and urgency.  Musculoskeletal:  Negative for back pain,  joint pain and myalgias.  Skin:  Negative for rash.  Neurological:  Positive for sensory change (Peripheral neuropathy). Negative for dizziness, tingling, focal weakness, seizures, weakness and headaches.  Endo/Heme/Allergies:  Does not bruise/bleed easily.  Psychiatric/Behavioral:  Negative for depression and suicidal ideas. The patient does not have insomnia.     Allergies  Allergen Reactions   Meloxicam Other (See Comments), Nausea Only and Rash    There are no problems to display for this patient.    Past Medical History:  Diagnosis Date   Anxiety    Dysplastic nevus 01/23/2023   right mid back under braline paraspinal  moderate atypia   Dysplastic nevus 01/23/2023   right mid back under bra line 3.0cm lat to spine mild atypia   Melanoma (HCC) 06/19/2022   R forearm, BRESLOW'S 0.2 MM CLARK/ANATOMIC LEVEL: II, Excised 07/09/2022, [facility] testing Class 1A   Skin cancer      Past Surgical History:  Procedure Laterality Date   TUBAL LIGATION      Social History   Socioeconomic History   Marital status: Married    Spouse name: Not on file   Number of children: Not on file   Years of education: Not on file   Highest education level: Not on file  Occupational History   Not on file  Tobacco Use   Smoking status: Never   Smokeless tobacco: Never  Vaping Use   Vaping status: Never Used  Substance and Sexual Activity   Alcohol use: Never   Drug use: Never   Sexual activity: Not Currently  Other Topics Concern   Not on file  Social History Narrative  Not on file   Social Determinants of Health   Financial Resource Strain: Patient Declined (11/08/2022)   Received from [facility], [facility]   Overall Financial Resource Strain (CARDIA)    Difficulty of Paying Living Expenses: Patient declined  Food Insecurity: No Food Insecurity (04/01/2023)   Hunger Vital Sign    Worried About Running Out of Food in the Last Year: Never true     Ran Out of Food in the Last Year: Never true  Transportation Needs: No Transportation Needs (11/08/2022)   Received from [facility], [facility]   [facility] - Transportation    In the past 12 months, has lack of transportation kept you from medical appointments or from getting medications?: No    Lack of Transportation (Non-Medical): No  Physical Activity: Not on file  Stress: Not on file  Social Connections: Not on file  Intimate Partner Violence: Not At Risk (04/01/2023)   Humiliation, Afraid, Rape, and Kick questionnaire    Fear of Current or Ex-Partner: No    Emotionally Abused: No    Physically Abused: No    Sexually Abused: No     Family History  Problem Relation Age of Onset   Breast cancer Maternal Aunt        3 aunts     Current Outpatient Medications:    estradiol (ESTRACE) 0.1 MG/GM vaginal cream, Place vaginally., Disp: , Rfl:    hydrOXYzine (ATARAX) 25 MG tablet, Take 1 tablet by mouth 3 (three) times daily as needed., Disp: , Rfl:    SUMAtriptan (IMITREX) 100 MG tablet, Take by mouth., Disp: , Rfl:    mupirocin ointment (BACTROBAN) 2 %, Apply 1 Application topically daily. With dressing changes, Disp: 22 g, Rfl: 0   ondansetron (ZOFRAN-ODT) 4 MG disintegrating tablet, , Disp: , Rfl:    Physical exam:  Vitals:   04/01/23 1340 04/01/23 1344  BP: (!) 155/92 (!) 175/72  Pulse: 81   Resp: 18   Temp: 97.6 F (36.4 C)   TempSrc: Tympanic   SpO2: 100%   Weight: 137 lb 11.2 oz (62.5 kg)   Height: 5\' 7"  (1.702 m)    Physical Exam Cardiovascular:     Rate and Rhythm: Normal rate and regular rhythm.     Heart sounds: Normal heart sounds.  Pulmonary:     Effort: Pulmonary effort is normal.     Breath sounds: Normal breath sounds.  Abdominal:     General: Bowel sounds are normal.     Palpations: Abdomen is soft.     Comments: No palpable hepatosplenomegaly  Skin:    General: Skin is warm and dry.  Neurological:     Mental  Status: She is alert and oriented to person, place, and time.            No data to display             No data to display          Assessment and plan- Patient is a 67 y.o. female referred for polycythemia  Discussed differences between primary polycythemia such as polycythemia vera versus secondary polycythemia.  Patient does not have any overt risk factors for secondary polycythemia such as obstructive sleep apnea, smoking or chronic lung disease.  I will be checking CBC, EPO levels and JAK2 mutation testing today.  In person or video visit in 2 weeks.  It appears that her hemoglobin has been mostly between 15-16 in  the past although the levels have been in this range despite donating blood about 4 times a year.  If workup for polycythemia vera is negative am inclined to watch her hemoglobin conservatively without the need for phlebotomy or further diagnostic testing such as bone marrow biopsy or additional blood tests.  Patient verbalized understanding of the plan   Thank you for this kind referral and the opportunity to participate in the care of this patient   Visit Diagnosis 1. Polycythemia     Dr. Owens Shark, MD, MPH [facility] at [facility] [phone] 04/01/2023

## 2023-04-02 LAB — ERYTHROPOIETIN: Erythropoietin: 7.8 m[IU]/mL (ref 2.6–18.5)

## 2023-04-06 LAB — JAK2 GENOTYPR

## 2023-04-16 ENCOUNTER — Ambulatory Visit: Payer: Medicare HMO | Admitting: Oncology

## 2023-05-01 ENCOUNTER — Ambulatory Visit: Payer: Medicare HMO | Admitting: Dermatology

## 2023-05-01 DIAGNOSIS — Z1283 Encounter for screening for malignant neoplasm of skin: Secondary | ICD-10-CM

## 2023-05-01 DIAGNOSIS — L578 Other skin changes due to chronic exposure to nonionizing radiation: Secondary | ICD-10-CM

## 2023-05-01 DIAGNOSIS — Z86018 Personal history of other benign neoplasm: Secondary | ICD-10-CM

## 2023-05-01 DIAGNOSIS — L821 Other seborrheic keratosis: Secondary | ICD-10-CM

## 2023-05-01 DIAGNOSIS — L820 Inflamed seborrheic keratosis: Secondary | ICD-10-CM | POA: Diagnosis not present

## 2023-05-01 DIAGNOSIS — Z85820 Personal history of malignant melanoma of skin: Secondary | ICD-10-CM

## 2023-05-01 DIAGNOSIS — L814 Other melanin hyperpigmentation: Secondary | ICD-10-CM | POA: Diagnosis not present

## 2023-05-01 DIAGNOSIS — W908XXA Exposure to other nonionizing radiation, initial encounter: Secondary | ICD-10-CM

## 2023-05-01 DIAGNOSIS — D1801 Hemangioma of skin and subcutaneous tissue: Secondary | ICD-10-CM

## 2023-05-01 DIAGNOSIS — D235 Other benign neoplasm of skin of trunk: Secondary | ICD-10-CM | POA: Diagnosis not present

## 2023-05-01 DIAGNOSIS — Z872 Personal history of diseases of the skin and subcutaneous tissue: Secondary | ICD-10-CM

## 2023-05-01 DIAGNOSIS — D489 Neoplasm of uncertain behavior, unspecified: Secondary | ICD-10-CM

## 2023-05-01 DIAGNOSIS — D229 Melanocytic nevi, unspecified: Secondary | ICD-10-CM

## 2023-05-01 NOTE — Progress Notes (Signed)
Follow-Up Visit   Subjective  Stacy Hardy is a 67 y.o. female who presents for the following: Skin Cancer Screening and Full Body Skin Exam hx of dysplastic nevi, hx of melanoma, hx of isks   The patient presents for Total-Body Skin Exam (TBSE) for skin cancer screening and mole check. The patient has spots, moles and lesions to be evaluated, some may be new or changing and the patient may have concern these could be cancer.  0.4 cm recurrent right mid back under bra line 3 cm lateral to spine   The following portions of the chart were reviewed this encounter and updated as appropriate: medications, allergies, medical history  Review of Systems:  No other skin or systemic complaints except as noted in HPI or Assessment and Plan.  Objective  Well appearing patient in no apparent distress; mood and affect are within normal limits.  A full examination was performed including scalp, head, eyes, ears, nose, lips, neck, chest, axillae, abdomen, back, buttocks, bilateral upper extremities, bilateral lower extremities, hands, feet, fingers, toes, fingernails, and toenails. All findings within normal limits unless otherwise noted below.   Relevant physical exam findings are noted in the Assessment and Plan.  right mid back under braline 3 cm lateral to spine 0.4 cm re pigmented macule   back x 6 (6) Erythematous stuck-on, waxy papule or plaque    Assessment & Plan   SKIN CANCER SCREENING PERFORMED TODAY.  ACTINIC DAMAGE - Chronic condition, secondary to cumulative UV/sun exposure - diffuse scaly erythematous macules with underlying dyspigmentation - Recommend daily broad spectrum sunscreen SPF 30+ to sun-exposed areas, reapply every 2 hours as needed.  - Staying in the shade or wearing long sleeves, sun glasses (UVA+UVB protection) and wide brim hats (4-inch brim around the entire circumference of the hat) are also recommended for sun protection.  - Call for new or changing  lesions.  LENTIGINES, SEBORRHEIC KERATOSES, HEMANGIOMAS - Benign normal skin lesions - Benign-appearing - Call for any changes  MELANOCYTIC NEVI - Tan-brown and/or pink-flesh-colored symmetric macules and papules - Benign appearing on exam today - Observation - Call clinic for new or changing moles - Recommend daily use of broad spectrum spf 30+ sunscreen to sun-exposed areas.   HISTORY OF DYSPLASTIC NEVUS Right mid back under braline paraspinal and right mid back under braline 3 cm lateral to spine  No evidence of recurrence today Recommend regular full body skin exams Recommend daily broad spectrum sunscreen SPF 30+ to sun-exposed areas, reapply every 2 hours as needed.  Call if any new or changing lesions are noted between office visits  HISTORY OF MELANOMA Right forearm 05/2022 BRESLOW'S 0.2 MM CLARK/ANATOMIC LEVEL: II, Excised 07/09/2022, [facility] Testing 1A  - No evidence of recurrence today - No lymphadenopathy - Recommend regular full body skin exams - Recommend daily broad spectrum sunscreen SPF 30+ to sun-exposed areas, reapply every 2 hours as needed.  - Call if any new or changing lesions are noted between office visits     Neoplasm of uncertain behavior right mid back under braline 3 cm lateral to spine  Epidermal / dermal shaving  Lesion diameter (cm):  0.4 Informed consent: discussed and consent obtained   Timeout: patient name, date of birth, surgical site, and procedure verified   Procedure prep:  Patient was prepped and draped in usual sterile fashion Prep type:  Isopropyl alcohol Anesthesia: the lesion was anesthetized in a standard fashion   Anesthetic:  1% lidocaine w/ epinephrine 1-100,000 buffered w/  8.4% NaHCO3 Instrument used: flexible razor blade   Hemostasis achieved with: pressure, aluminum chloride and electrodesiccation   Outcome: patient tolerated procedure well   Post-procedure details: sterile dressing applied and wound care instructions  given   Dressing type: bandage and petrolatum    Shv removal for recurrent dysplastic nevus moderate   Inflamed seborrheic keratosis (6) back x 6  Symptomatic, irritating, patient would like treated.  Destruction of lesion - back x 6 (6) Complexity: simple   Destruction method: cryotherapy   Informed consent: discussed and consent obtained   Timeout:  patient name, date of birth, surgical site, and procedure verified Lesion destroyed using liquid nitrogen: Yes   Region frozen until ice ball extended beyond lesion: Yes   Outcome: patient tolerated procedure well with no complications   Post-procedure details: wound care instructions given     Return in about 4 months (around 08/29/2023) for TBSE.  IAsher Muir, CMA, am acting as scribe for Armida Sans, MD.   Documentation: I have reviewed the above documentation for accuracy and completeness, and I agree with the above.  Armida Sans, MD

## 2023-05-01 NOTE — Patient Instructions (Addendum)

## 2023-05-11 ENCOUNTER — Encounter: Payer: Self-pay | Admitting: Dermatology

## 2023-09-03 ENCOUNTER — Ambulatory Visit: Payer: Medicare HMO | Admitting: Dermatology

## 2023-11-18 ENCOUNTER — Encounter: Payer: Self-pay | Admitting: Dermatology

## 2023-11-18 ENCOUNTER — Ambulatory Visit (INDEPENDENT_AMBULATORY_CARE_PROVIDER_SITE_OTHER): Payer: Medicare HMO | Admitting: Dermatology

## 2023-11-18 DIAGNOSIS — L578 Other skin changes due to chronic exposure to nonionizing radiation: Secondary | ICD-10-CM | POA: Diagnosis not present

## 2023-11-18 DIAGNOSIS — D1801 Hemangioma of skin and subcutaneous tissue: Secondary | ICD-10-CM

## 2023-11-18 DIAGNOSIS — D229 Melanocytic nevi, unspecified: Secondary | ICD-10-CM

## 2023-11-18 DIAGNOSIS — Z1283 Encounter for screening for malignant neoplasm of skin: Secondary | ICD-10-CM | POA: Diagnosis not present

## 2023-11-18 DIAGNOSIS — L814 Other melanin hyperpigmentation: Secondary | ICD-10-CM

## 2023-11-18 DIAGNOSIS — W908XXA Exposure to other nonionizing radiation, initial encounter: Secondary | ICD-10-CM

## 2023-11-18 DIAGNOSIS — L821 Other seborrheic keratosis: Secondary | ICD-10-CM

## 2023-11-18 DIAGNOSIS — Z85820 Personal history of malignant melanoma of skin: Secondary | ICD-10-CM

## 2023-11-18 DIAGNOSIS — Z86018 Personal history of other benign neoplasm: Secondary | ICD-10-CM

## 2023-11-18 NOTE — Patient Instructions (Signed)

## 2023-11-18 NOTE — Progress Notes (Signed)
   Follow-Up Visit   Subjective  Stacy Hardy is a 68 y.o. female who presents for the following: Skin Cancer Screening and Full Body Skin Exam hx of Melanoma, Dysplastic nevi  The patient presents for Total-Body Skin Exam (TBSE) for skin cancer screening and mole check. The patient has spots, moles and lesions to be evaluated, some may be new or changing and the patient may have concern these could be cancer.  The following portions of the chart were reviewed this encounter and updated as appropriate: medications, allergies, medical history  Review of Systems:  No other skin or systemic complaints except as noted in HPI or Assessment and Plan.  Objective  Well appearing patient in no apparent distress; mood and affect are within normal limits.  A full examination was performed including scalp, head, eyes, ears, nose, lips, neck, chest, axillae, abdomen, back, buttocks, bilateral upper extremities, bilateral lower extremities, hands, feet, fingers, toes, fingernails, and toenails. All findings within normal limits unless otherwise noted below.   Relevant physical exam findings are noted in the Assessment and Plan.   Assessment & Plan   SKIN CANCER SCREENING PERFORMED TODAY.  ACTINIC DAMAGE - Chronic condition, secondary to cumulative UV/sun exposure - diffuse scaly erythematous macules with underlying dyspigmentation - Recommend daily broad spectrum sunscreen SPF 30+ to sun-exposed areas, reapply every 2 hours as needed.  - Staying in the shade or wearing long sleeves, sun glasses (UVA+UVB protection) and wide brim hats (4-inch brim around the entire circumference of the hat) are also recommended for sun protection.  - Call for new or changing lesions.  LENTIGINES, SEBORRHEIC KERATOSES, HEMANGIOMAS - Benign normal skin lesions - Benign-appearing - Call for any changes  MELANOCYTIC NEVI - Tan-brown and/or pink-flesh-colored symmetric macules and papules - Benign appearing on  exam today - Observation - Call clinic for new or changing moles - Recommend daily use of broad spectrum spf 30+ sunscreen to sun-exposed areas.   HISTORY OF MELANOMA - No evidence of recurrence today - No lymphadenopathy - Recommend regular full body skin exams - Recommend daily broad spectrum sunscreen SPF 30+ to sun-exposed areas, reapply every 2 hours as needed.  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  Call if any new or changing lesions are noted between office visits  - R mid back under bra line paraspinal, R mid back under bra line 3.0cm lat to spine   Return in about 4 months (around 03/20/2024) for TBSE, Hx of Melanoma, Hx of Dysplastic nevi.  I, Rollie Clipper, RMA, am acting as scribe for Celine Collard, MD .   Documentation: I have reviewed the above documentation for accuracy and completeness, and I agree with the above.  Celine Collard, MD

## 2023-12-17 ENCOUNTER — Other Ambulatory Visit: Payer: Self-pay | Admitting: Pediatrics

## 2023-12-17 DIAGNOSIS — R7989 Other specified abnormal findings of blood chemistry: Secondary | ICD-10-CM

## 2023-12-24 ENCOUNTER — Ambulatory Visit
Admission: RE | Admit: 2023-12-24 | Discharge: 2023-12-24 | Disposition: A | Discharge status: Home / Self Care | Source: Ambulatory Visit | Attending: Pediatrics | Admitting: Pediatrics

## 2023-12-24 DIAGNOSIS — R7989 Other specified abnormal findings of blood chemistry: Secondary | ICD-10-CM | POA: Insufficient documentation

## 2024-01-28 ENCOUNTER — Encounter: Payer: Self-pay | Admitting: Dermatology

## 2024-01-28 ENCOUNTER — Ambulatory Visit: Admitting: Dermatology

## 2024-01-28 DIAGNOSIS — L578 Other skin changes due to chronic exposure to nonionizing radiation: Secondary | ICD-10-CM | POA: Diagnosis not present

## 2024-01-28 DIAGNOSIS — I781 Nevus, non-neoplastic: Secondary | ICD-10-CM | POA: Diagnosis not present

## 2024-01-28 DIAGNOSIS — W908XXA Exposure to other nonionizing radiation, initial encounter: Secondary | ICD-10-CM | POA: Diagnosis not present

## 2024-01-28 DIAGNOSIS — Z7189 Other specified counseling: Secondary | ICD-10-CM

## 2024-01-28 DIAGNOSIS — L738 Other specified follicular disorders: Secondary | ICD-10-CM

## 2024-01-28 NOTE — Progress Notes (Unsigned)
   Follow-Up Visit   Subjective  Stacy Hardy is a 68 y.o. female who presents for the following: Spots on each side of nose. Dur: few weeks. Denies bleeding, stinging, pain.   The patient has spots, moles and lesions to be evaluated, some may be new or changing and the patient may have concern these could be cancer.  The following portions of the chart were reviewed this encounter and updated as appropriate: medications, allergies, medical history  Review of Systems:  No other skin or systemic complaints except as noted in HPI or Assessment and Plan.  Objective  Well appearing patient in no apparent distress; mood and affect are within normal limits.  A focused examination was performed of the following areas: Face, nose  Relevant physical exam findings are noted in the Assessment and Plan.   Assessment & Plan   TELANGIECTASIA Exam: dilated blood vessel(s) at nose Treatment Plan: Benign appearing on exam Call for changes  Sebaceous Hyperplasia - Small yellow papules with a central dell at nose - Benign-appearing - Observe. Call for changes.   Benign normal topography of skin of nose with Sebaceous hyperplasia and pores Pt identifies areas as Indentations Exam: indentations at B/L nose Treatment: Advised this is normal skin texture. Exaggerated by surrounding sebaceous hyperplasia.  Benign-appearing.  Observation.  Call clinic for new or changing lesions.  Recommend daily use of broad spectrum spf 30+ sunscreen to sun-exposed areas.  Reassured and counseled.  ACTINIC DAMAGE - chronic, secondary to cumulative UV radiation exposure/sun exposure over time - diffuse scaly erythematous macules with underlying dyspigmentation - Recommend daily broad spectrum sunscreen SPF 30+ to sun-exposed areas, reapply every 2 hours as needed.  - Recommend staying in the shade or wearing long sleeves, sun glasses (UVA+UVB protection) and wide brim hats (4-inch brim around the entire  circumference of the hat). - Call for new or changing lesions. ACTINIC SKIN DAMAGE   TELANGIECTASIA   SEBACEOUS HYPERPLASIA OF FACE   COUNSELING AND COORDINATION OF CARE     Return for TBSE As Scheduled, HxMM, With Dr. Hester.  I, Jill Parcell, CMA, am acting as scribe for Alm Hester, MD.   Documentation: I have reviewed the above documentation for accuracy and completeness, and I agree with the above.  Alm Hester, MD

## 2024-01-28 NOTE — Patient Instructions (Addendum)
Recommend daily broad spectrum sunscreen SPF 30+ to sun-exposed areas, reapply every 2 hours as needed. Call for new or changing lesions.  Staying in the shade or wearing long sleeves, sun glasses (UVA+UVB protection) and wide brim hats (4-inch brim around the entire circumference of the hat) are also recommended for sun protection.    Basic OTC daily skin care regimen to prevent photoaging:   Recommend facial moisturizer with sunscreen SPF 30 every morning (OTC brands include CeraVe AM, Neutrogena, Eucerin, Cetaphil, Aveeno, La Roche Posay).  Can also apply a topical Vit C serum which is an antioxidant (OTC brands include CeraVe, La Roche Posay, and The Ordinary) underneath sunscreen in morning. If you are outside during the day in the summer for extended periods, especially swimming and/or sweating, make sure you apply a water resistant facial sunscreen lotion spf 30 or higher.   At night recommend a cream with retinol (a vitamin A derivative which stimulates collagen production) like CeraVe skin renewing retinol serum or ROC retinol correxion cream or Neutrogena rapid wrinkle repair cream. Retinol may cause skin irritation in people with sensitive skin.  Can use it every other day and/or apply on top of a hyaluronic acid (HA) moisturizer/serum (Neutrogena Hydroboost water cream) if better tolerated that way.  Retinol may also help with lightening brown spots.   Our office sells high quality, medically tested skin care lines such as Elta MD sunscreens (with Zinc), and Alastin skin care products, which are very effective in treating photoaging. The Alastin line includes cosmeceutical grade Vit.C serum, HA serum, Elastin stimulating moisturizers/serums, lightening serum, and sunscreens.  If you want prescription treatment, then you would need an appointment (Rx tretinoin and fade creams, Botox, filler injections, laser treatments, etc.) These prescriptions and procedures are not covered by insurance but  work very well.    Due to recent changes in healthcare laws, you may see results of your pathology and/or laboratory studies on MyChart before the doctors have had a chance to review them. We understand that in some cases there may be results that are confusing or concerning to you. Please understand that not all results are received at the same time and often the doctors may need to interpret multiple results in order to provide you with the best plan of care or course of treatment. Therefore, we ask that you please give [place] 2 business days to thoroughly review all your results before contacting the office for clarification. Should we see a critical lab result, you will be contacted sooner.   If You Need Anything After Your Visit  If you have any questions or concerns for your doctor, please call our main line at [phone] and press option 4 to reach your doctor's medical assistant. If no one answers, please leave a voicemail as directed and we will return your call as soon as possible. Messages left after 4 pm will be answered the following business day.   You may also send [place] a message via MyChart. We typically respond to MyChart messages within 1-2 business days.  For prescription refills, please ask your pharmacy to contact our office. Our fax number is [phone].  If you have an urgent issue when the clinic is closed that cannot wait until the next business day, you can page your doctor at the number below.    Please note that while we do our best to be available for urgent issues outside of office hours, we are not available 24/7.   If you have an  urgent issue and are unable to reach [place], you may choose to seek medical care at your doctor's office, retail clinic, urgent care center, or emergency room.  If you have a medical emergency, please immediately call 911 or go to the emergency department.  Pager Numbers  - Dr. Gwen Pounds: [phone]  - Dr. Roseanne Reno: [phone]  - Dr.  Katrinka Blazing: [phone]   In the event of inclement weather, please call our main line at [phone] for an update on the status of any delays or closures.  Dermatology Medication Tips: Please keep the boxes that topical medications come in in order to help keep track of the instructions about where and how to use these. Pharmacies typically print the medication instructions only on the boxes and not directly on the medication tubes.   If your medication is too expensive, please contact our office at [phone] option 4 or send [place] a message through MyChart.   We are unable to tell what your co-pay for medications will be in advance as this is different depending on your insurance coverage. However, we may be able to find a substitute medication at lower cost or fill out paperwork to get insurance to cover a needed medication.   If a prior authorization is required to get your medication covered by your insurance company, please allow [place] 1-2 business days to complete this process.  Drug prices often vary depending on where the prescription is filled and some pharmacies may offer cheaper prices.  The website www.goodrx.com contains coupons for medications through different pharmacies. The prices here do not account for what the cost may be with help from insurance (it may be cheaper with your insurance), but the website can give you the price if you did not use any insurance.  - You can print the associated coupon and take it with your prescription to the pharmacy.  - You may also stop by our office during regular business hours and pick up a GoodRx coupon card.  - If you need your prescription sent electronically to a different pharmacy, notify our office through [facility] or by phone at [phone] option 4.     Si Usted Necesita Algo Despus de Su Visita  Tambin puede enviarnos un mensaje a travs de Clinical cytogeneticist. Por lo general respondemos a los mensajes de MyChart en el transcurso  de 1 a 2 das hbiles.  Para renovar recetas, por favor pida a su farmacia que se ponga en contacto con nuestra oficina. Annie Sable de fax es [place] [phone].  Si tiene un asunto urgente cuando la clnica est cerrada y que no puede esperar hasta el siguiente da hbil, puede llamar/localizar a su doctor(a) al nmero que aparece a continuacin.   Por favor, tenga en cuenta que aunque hacemos todo lo posible para estar disponibles para asuntos urgentes fuera del horario de [place], no estamos disponibles las 24 horas del da, los 7 [address]  [address] de la [place].   Si tiene un problema urgente y no puede comunicarse con nosotros, puede optar por buscar atencin mdica  en el consultorio de su doctor(a), en una clnica privada, en un centro de atencin urgente o en una sala de emergencias.  Si tiene Engineer, drilling, por favor llame inmediatamente al 911 o vaya a la sala de emergencias.  Nmeros de bper  - Dr. Gwen Pounds: [phone]  - Dra. Roseanne Reno: [phone]  - Dr. Katrinka Blazing: [phone]   En caso de inclemencias del tiempo, por favor llame a nuestra lnea principal al [phone]  para [employer] de cualquier retraso o cierre.  Consejos para la medicacin en dermatologa: Por favor, guarde las cajas en las que vienen los medicamentos de uso tpico para ayudarle a seguir las instrucciones sobre dnde y cmo usarlos. Las farmacias generalmente imprimen las instrucciones del medicamento slo en las cajas y no directamente en los tubos del [place].   Si su medicamento es muy caro, por favor, pngase en contacto con Rolm Gala llamando al [phone] y presione la opcin 4 o envenos un mensaje a travs de Clinical cytogeneticist.   No podemos decirle cul ser su copago por los medicamentos por adelantado ya que esto es diferente dependiendo de la cobertura de su seguro. Sin embargo, es posible que podamos encontrar un medicamento sustituto a Audiological scientist un formulario  para que el seguro cubra el medicamento que se considera necesario.   Si se requiere una autorizacin previa para que su compaa de seguros [place] su medicamento, por favor permtanos de 1 a 2 das hbiles para completar [address]  Los precios de los medicamentos varan con frecuencia dependiendo del Environmental consultant de dnde se surte la receta y alguna farmacias pueden ofrecer precios ms baratos.  El sitio web www.goodrx.com tiene cupones para medicamentos de Health and safety inspector. Los precios aqu no tienen en cuenta lo que podra costar con la ayuda del seguro (puede ser ms barato con su seguro), pero el sitio web puede darle el precio si no utiliz Tourist information centre manager.  - Puede imprimir el cupn correspondiente y llevarlo con su receta a la farmacia.  - Tambin puede pasar por nuestra oficina durante el horario de atencin regular y Education officer, museum una tarjeta de cupones de GoodRx.  - Si necesita que su receta se enve electrnicamente a una farmacia diferente, informe a nuestra oficina a travs de MyChart de [place] o por telfono llamando al [phone] y presione la opcin 4.

## 2024-01-29 ENCOUNTER — Encounter: Payer: Self-pay | Admitting: Dermatology

## 2024-03-23 ENCOUNTER — Ambulatory Visit: Admitting: Dermatology

## 2024-03-23 ENCOUNTER — Encounter: Payer: Self-pay | Admitting: Dermatology

## 2024-03-23 DIAGNOSIS — Z1283 Encounter for screening for malignant neoplasm of skin: Secondary | ICD-10-CM | POA: Diagnosis not present

## 2024-03-23 DIAGNOSIS — W908XXA Exposure to other nonionizing radiation, initial encounter: Secondary | ICD-10-CM | POA: Diagnosis not present

## 2024-03-23 DIAGNOSIS — D229 Melanocytic nevi, unspecified: Secondary | ICD-10-CM

## 2024-03-23 DIAGNOSIS — D2272 Melanocytic nevi of left lower limb, including hip: Secondary | ICD-10-CM

## 2024-03-23 DIAGNOSIS — L578 Other skin changes due to chronic exposure to nonionizing radiation: Secondary | ICD-10-CM | POA: Diagnosis not present

## 2024-03-23 DIAGNOSIS — L821 Other seborrheic keratosis: Secondary | ICD-10-CM | POA: Diagnosis not present

## 2024-03-23 DIAGNOSIS — L820 Inflamed seborrheic keratosis: Secondary | ICD-10-CM

## 2024-03-23 DIAGNOSIS — Z86018 Personal history of other benign neoplasm: Secondary | ICD-10-CM

## 2024-03-23 DIAGNOSIS — L814 Other melanin hyperpigmentation: Secondary | ICD-10-CM

## 2024-03-23 DIAGNOSIS — D692 Other nonthrombocytopenic purpura: Secondary | ICD-10-CM

## 2024-03-23 DIAGNOSIS — D225 Melanocytic nevi of trunk: Secondary | ICD-10-CM

## 2024-03-23 DIAGNOSIS — D492 Neoplasm of unspecified behavior of bone, soft tissue, and skin: Secondary | ICD-10-CM

## 2024-03-23 DIAGNOSIS — Z85820 Personal history of malignant melanoma of skin: Secondary | ICD-10-CM

## 2024-03-23 NOTE — Patient Instructions (Addendum)

## 2024-03-23 NOTE — Progress Notes (Signed)
 Follow-Up Visit   Subjective  Stacy Hardy is a 68 y.o. female who presents for the following: Skin Cancer Screening and Full Body Skin Exam hx of Melanoma, Dysplastic Nevi  The patient presents for Total-Body Skin Exam (TBSE) for skin cancer screening and mole check. The patient has spots, moles and lesions to be evaluated, some may be new or changing and the patient may have concern these could be cancer.  The following portions of the chart were reviewed this encounter and updated as appropriate: medications, allergies, medical history  Review of Systems:  No other skin or systemic complaints except as noted in HPI or Assessment and Plan.  Objective  Well appearing patient in no apparent distress; mood and affect are within normal limits.  A full examination was performed including scalp, head, eyes, ears, nose, lips, neck, chest, axillae, abdomen, back, buttocks, bilateral upper extremities, bilateral lower extremities, hands, feet, fingers, toes, fingernails, and toenails. All findings within normal limits unless otherwise noted below.   Relevant physical exam findings are noted in the Assessment and Plan.  back x 16, L knee x 1 (17) Stuck on waxy paps with erythema Mid to upper back spinal Irregular brown macule 0.6cm  L mid back lat 9.0cm lat to spine Irregular brown macule 0.6cm  L post thigh Irregular brown macule 0.6cm   Assessment & Plan   SKIN CANCER SCREENING PERFORMED TODAY.  ACTINIC DAMAGE - Chronic condition, secondary to cumulative UV/sun exposure - diffuse scaly erythematous macules with underlying dyspigmentation - Recommend daily broad spectrum sunscreen SPF 30+ to sun-exposed areas, reapply every 2 hours as needed.  - Staying in the shade or wearing long sleeves, sun glasses (UVA+UVB protection) and wide brim hats (4-inch brim around the entire circumference of the hat) are also recommended for sun protection.  - Call for new or changing  lesions.  LENTIGINES, SEBORRHEIC KERATOSES, HEMANGIOMAS - Benign normal skin lesions - Benign-appearing - Call for any changes  MELANOCYTIC NEVI - Tan-brown and/or pink-flesh-colored symmetric macules and papules - Benign appearing on exam today - Observation - Call clinic for new or changing moles - Recommend daily use of broad spectrum spf 30+ sunscreen to sun-exposed areas.   HISTORY OF MELANOMA - No evidence of recurrence today - No lymphadenopathy - Recommend regular full body skin exams - Recommend daily broad spectrum sunscreen SPF 30+ to sun-exposed areas, reapply every 2 hours as needed.  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  Call if any new or changing lesions are noted between office visits  - R mid back under braline paraspinal, R mid back under braline 3.0cm lat to spine  Purpura - Chronic; persistent and recurrent.  Treatable, but not curable. arms - Violaceous macules and patches - Benign - Related to trauma, age, sun damage and/or use of blood thinners, chronic use of topical and/or oral steroids - Observe - Can use OTC arnica containing moisturizer such as Dermend Bruise Formula if desired - Call for worsening or other concerns  INFLAMED SEBORRHEIC KERATOSIS (17) back x 16, L knee x 1 (17) Symptomatic, irritating, patient would like treated. Destruction of lesion - back x 16, L knee x 1 (  17) Complexity: simple   Destruction method: cryotherapy   Informed consent: discussed and consent obtained   Timeout:  patient name, date of birth, surgical site, and procedure verified Lesion destroyed using liquid nitrogen: Yes   Region  frozen until ice ball extended beyond lesion: Yes   Outcome: patient tolerated procedure well with no complications   Post-procedure details: wound care instructions given    NEOPLASM OF SKIN (3) Mid to upper back spinal Epidermal / dermal shaving  Lesion diameter (cm):  0.6 Informed consent: discussed and consent obtained   Timeout: patient name, date of birth, surgical site, and procedure verified   Procedure prep:  Patient was prepped and draped in usual sterile fashion Prep type:  Isopropyl alcohol Anesthesia: the lesion was anesthetized in a standard fashion   Anesthetic:  1% lidocaine w/ epinephrine 1-100,000 buffered w/ 8.4% NaHCO3 Instrument used: flexible razor blade   Hemostasis achieved with: pressure, aluminum chloride and electrodesiccation   Outcome: patient tolerated procedure well   Post-procedure details: sterile dressing applied and wound care instructions given   Dressing type: bandage (mupirocin )    Specimen 1 - Surgical pathology Differential Diagnosis: Nevus vs Dysplastic nevus  Check Margins: yes Irregular brown macule 0.6cm L mid back lat 9.0cm lat to spine Epidermal / dermal shaving  Lesion diameter (cm):  0.6 Informed consent: discussed and consent obtained   Timeout: patient name, date of birth, surgical site, and procedure verified   Procedure prep:  Patient was prepped and draped in usual sterile fashion Prep type:  Isopropyl alcohol Anesthesia: the lesion was anesthetized in a standard fashion   Anesthetic:  1% lidocaine w/ epinephrine 1-100,000 buffered w/ 8.4% NaHCO3 Instrument used: flexible razor blade   Hemostasis achieved with: pressure, aluminum chloride and electrodesiccation   Outcome: patient tolerated procedure well   Post-procedure details: sterile dressing applied and wound care instructions given   Dressing type: bandage (mupirocin )    Specimen 2 - Surgical pathology Differential Diagnosis: Nevus vs Dysplastic Nevus  Check  Margins: yes Irregular brown macule 0.6cm L post thigh Epidermal / dermal shaving  Lesion diameter (cm):  0.6 Informed consent: discussed and consent obtained   Timeout: patient name, date of birth, surgical site, and procedure verified   Procedure prep:  Patient was prepped and draped in usual sterile fashion Prep type:  Isopropyl alcohol Anesthesia: the lesion was anesthetized in a standard fashion   Anesthetic:  1% lidocaine w/ epinephrine 1-100,000 buffered w/ 8.4% NaHCO3 Instrument used: flexible razor blade   Hemostasis achieved with: pressure, aluminum chloride and electrodesiccation   Outcome: patient tolerated procedure well   Post-procedure details: sterile dressing applied and wound care instructions given   Dressing type: bandage (mupirocin )    Specimen 3 - Surgical pathology Differential Diagnosis: Nevus vs Dysplastic Nevus  Check Margins: yes Irregular brown macule 0.6cm Return in about 4 months (around 07/23/2024) for TBSE, Hx of Melanoma, Hx of Dysplastic nevi.  I, Grayce Saunas, RMA, am acting as scribe for Alm Rhyme, MD .   Documentation: I have reviewed the above documentation for accuracy and completeness, and I agree with the above.  Alm Rhyme, MD

## 2024-03-24 ENCOUNTER — Ambulatory Visit: Payer: Self-pay | Admitting: Dermatology

## 2024-03-24 ENCOUNTER — Encounter: Payer: Self-pay | Admitting: Dermatology

## 2024-03-24 LAB — SURGICAL PATHOLOGY

## 2024-03-24 NOTE — Telephone Encounter (Addendum)
 Called and discussed bx results with patient. She verbalized understanding and denied further questions. Will recheck moderate dysplastic moles at next follow up.  ----- Message from Alm Rhyme sent at 03/24/2024  5:30 PM EDT ----- FINAL DIAGNOSIS        1. Skin, mid to upper back spinal :       PIGMENTED SEBORRHEIC KERATOSIS        2. Skin, L mid back lat 9.0cm lat to spine :       DYSPLASTIC COMPOUND NEVUS WITH MODERATE ATYPIA, LIMITED MARGINS FREE        3. Skin, L post thigh :       DYSPLASTIC JUNCTIONAL NEVUS WITH MODERATE ATYPIA, INFLAMED, LIMITED MARGINS FREE    1- benign Keratosis No further treatment needed  2&3 - Both Moderate Dysplastic Recheck next visit ----- Message ----- From: Interface, Lab In [place] Sent: 03/24/2024   5:25 PM EDT To: Alm JAYSON Rhyme, MD

## 2024-07-28 ENCOUNTER — Ambulatory Visit: Admitting: Dermatology

## 2024-07-28 ENCOUNTER — Encounter: Payer: Self-pay | Admitting: Dermatology

## 2024-07-28 DIAGNOSIS — L821 Other seborrheic keratosis: Secondary | ICD-10-CM

## 2024-07-28 DIAGNOSIS — W908XXA Exposure to other nonionizing radiation, initial encounter: Secondary | ICD-10-CM | POA: Diagnosis not present

## 2024-07-28 DIAGNOSIS — D229 Melanocytic nevi, unspecified: Secondary | ICD-10-CM

## 2024-07-28 DIAGNOSIS — Z85820 Personal history of malignant melanoma of skin: Secondary | ICD-10-CM

## 2024-07-28 DIAGNOSIS — D692 Other nonthrombocytopenic purpura: Secondary | ICD-10-CM | POA: Diagnosis not present

## 2024-07-28 DIAGNOSIS — Z1283 Encounter for screening for malignant neoplasm of skin: Secondary | ICD-10-CM | POA: Diagnosis not present

## 2024-07-28 DIAGNOSIS — L814 Other melanin hyperpigmentation: Secondary | ICD-10-CM | POA: Diagnosis not present

## 2024-07-28 DIAGNOSIS — Z86018 Personal history of other benign neoplasm: Secondary | ICD-10-CM

## 2024-07-28 DIAGNOSIS — L578 Other skin changes due to chronic exposure to nonionizing radiation: Secondary | ICD-10-CM

## 2024-07-28 DIAGNOSIS — D1801 Hemangioma of skin and subcutaneous tissue: Secondary | ICD-10-CM | POA: Diagnosis not present

## 2024-07-28 NOTE — Progress Notes (Signed)
 "  Follow-Up Visit   Subjective  Stacy Hardy is a 69 y.o. female who presents for the following: Skin Cancer Screening and Full Body Skin Exam Hx of melanoma 4 month follow up Hx of isks and aks Hx of dysplastic nevi  Most recent dysplastic nevi 03/23/2024 Left post thigh and left mid back lateral 9 cm lateral to spine  The patient presents for Total-Body Skin Exam (TBSE) for skin cancer screening and mole check. The patient has spots, moles and lesions to be evaluated, some may be new or changing and the patient may have concern these could be cancer.  The following portions of the chart were reviewed this encounter and updated as appropriate: medications, allergies, medical history  Review of Systems:  No other skin or systemic complaints except as noted in HPI or Assessment and Plan.  Objective  Well appearing patient in no apparent distress; mood and affect are within normal limits.  A full examination was performed including scalp, head, eyes, ears, nose, lips, neck, chest, axillae, abdomen, back, buttocks, bilateral upper extremities, bilateral lower extremities, hands, feet, fingers, toes, fingernails, and toenails. All findings within normal limits unless otherwise noted below.   Relevant physical exam findings are noted in the Assessment and Plan.    Assessment & Plan   HISTORY OF MELANOMA 06/19/2022 - R forearm BRESLOW'S 0.2 MM CLARK/ANATOMIC LEVEL: II, Excised 07/09/2022, [place] testing Class 1A  - No evidence of recurrence today - No lymphadenopathy - Recommend regular full body skin exams - Recommend daily broad spectrum sunscreen SPF 30+ to sun-exposed areas, reapply every 2 hours as needed.  - Call if any new or changing lesions are noted between office visits   HISTORY OF DYSPLASTIC NEVUS - 03/23/2024 left mid back lat 9 cm lat to spine moderate  And left posterior thigh moderate  - 01/23/2024 R mid back under braline paraspinal, R mid back under braline 3.0cm  lat to spine No evidence of recurrence today Recommend regular full body skin exams Recommend daily broad spectrum sunscreen SPF 30+ to sun-exposed areas, reapply every 2 hours as needed.  Call if any new or changing lesions are noted between office visits    SKIN CANCER SCREENING PERFORMED TODAY.  ACTINIC DAMAGE - Chronic condition, secondary to cumulative UV/sun exposure - diffuse scaly erythematous macules with underlying dyspigmentation - Recommend daily broad spectrum sunscreen SPF 30+ to sun-exposed areas, reapply every 2 hours as needed.  - Staying in the shade or wearing long sleeves, sun glasses (UVA+UVB protection) and wide brim hats (4-inch brim around the entire circumference of the hat) are also recommended for sun protection.  - Call for new or changing lesions.  LENTIGINES, SEBORRHEIC KERATOSES, HEMANGIOMAS - Benign normal skin lesions - Benign-appearing - Call for any changes  MELANOCYTIC NEVI - Tan-brown and/or pink-flesh-colored symmetric macules and papules - Benign appearing on exam today - Observation - Call clinic for new or changing moles - Recommend daily use of broad spectrum spf 30+ sunscreen to sun-exposed areas.   Purpura - Chronic; persistent and recurrent.  Treatable, but not curable. - Violaceous macules and patches - Benign - Related to trauma, age, sun damage and/or use of blood thinners, chronic use of topical and/or oral steroids - Observe - Can use OTC arnica containing moisturizer such as Dermend Bruise Formula if desired - Call for worsening or other concerns   Return in about 6 months (around 01/25/2025) for TBSE hx of melanoma .  IEleanor Blush, CMA, am acting  as scribe for Alm Rhyme, MD.   Documentation: I have reviewed the above documentation for accuracy and completeness, and I agree with the above.  Alm Rhyme, MD    "

## 2024-07-28 NOTE — Patient Instructions (Signed)

## 2025-01-26 ENCOUNTER — Ambulatory Visit: Admitting: Dermatology
